# Patient Record
Sex: Male | Born: 1960 | Race: White | Hispanic: No | Marital: Single | Smoking: Never smoker
Health system: Southern US, Community
[De-identification: ages and names within clinical notes are randomized; demographics above are authoritative.]

## PROBLEM LIST (undated history)

## (undated) DIAGNOSIS — T7840XA Allergy, unspecified, initial encounter: Secondary | ICD-10-CM

## (undated) DIAGNOSIS — F1910 Other psychoactive substance abuse, uncomplicated: Secondary | ICD-10-CM

## (undated) DIAGNOSIS — E079 Disorder of thyroid, unspecified: Secondary | ICD-10-CM

## (undated) DIAGNOSIS — I10 Essential (primary) hypertension: Secondary | ICD-10-CM

## (undated) HISTORY — PX: FRACTURE SURGERY: SHX138

## (undated) HISTORY — DX: Other psychoactive substance abuse, uncomplicated: F19.10

## (undated) HISTORY — DX: Essential (primary) hypertension: I10

## (undated) HISTORY — DX: Disorder of thyroid, unspecified: E07.9

## (undated) HISTORY — PX: WISDOM TOOTH EXTRACTION: SHX21

## (undated) HISTORY — DX: Allergy, unspecified, initial encounter: T78.40XA

---

## 2000-06-07 ENCOUNTER — Emergency Department (HOSPITAL_COMMUNITY): Admission: EM | Admit: 2000-06-07 | Discharge: 2000-06-07 | Payer: Self-pay | Admitting: Emergency Medicine

## 2002-10-08 ENCOUNTER — Emergency Department (HOSPITAL_COMMUNITY): Admission: EM | Admit: 2002-10-08 | Discharge: 2002-10-08 | Payer: Self-pay | Admitting: Emergency Medicine

## 2004-07-05 ENCOUNTER — Emergency Department (HOSPITAL_COMMUNITY): Admission: EM | Admit: 2004-07-05 | Discharge: 2004-07-05 | Payer: Self-pay | Admitting: Emergency Medicine

## 2004-07-14 ENCOUNTER — Encounter: Admission: RE | Admit: 2004-07-14 | Discharge: 2004-07-14 | Payer: Self-pay | Admitting: Internal Medicine

## 2004-07-26 ENCOUNTER — Ambulatory Visit (HOSPITAL_COMMUNITY): Admission: RE | Admit: 2004-07-26 | Discharge: 2004-07-26 | Payer: Self-pay | Admitting: Internal Medicine

## 2004-07-26 ENCOUNTER — Encounter (INDEPENDENT_AMBULATORY_CARE_PROVIDER_SITE_OTHER): Payer: Self-pay | Admitting: Specialist

## 2004-08-15 ENCOUNTER — Ambulatory Visit: Payer: Self-pay | Admitting: Endocrinology

## 2004-09-14 ENCOUNTER — Ambulatory Visit: Payer: Self-pay | Admitting: Internal Medicine

## 2004-10-09 HISTORY — PX: THYROID SURGERY: SHX805

## 2004-10-17 ENCOUNTER — Ambulatory Visit: Payer: Self-pay | Admitting: Internal Medicine

## 2005-02-03 ENCOUNTER — Encounter (INDEPENDENT_AMBULATORY_CARE_PROVIDER_SITE_OTHER): Payer: Self-pay | Admitting: Specialist

## 2005-02-03 ENCOUNTER — Observation Stay (HOSPITAL_COMMUNITY): Admission: RE | Admit: 2005-02-03 | Discharge: 2005-02-04 | Payer: Self-pay | Admitting: General Surgery

## 2005-05-11 ENCOUNTER — Ambulatory Visit: Payer: Self-pay | Admitting: Internal Medicine

## 2005-07-06 IMAGING — US US BIOPSY
1 series · 6 of 6 positions shown · non-contrast
Comparison: none

CLINICAL DATA: thyroid nodule; right thyroid lesion
 ULTRASOUND-GUIDED THYROID BIOPSY

[Series 1: unknown · 0.07mm/px · 6 of 6 slices shown]
[im 1/6]
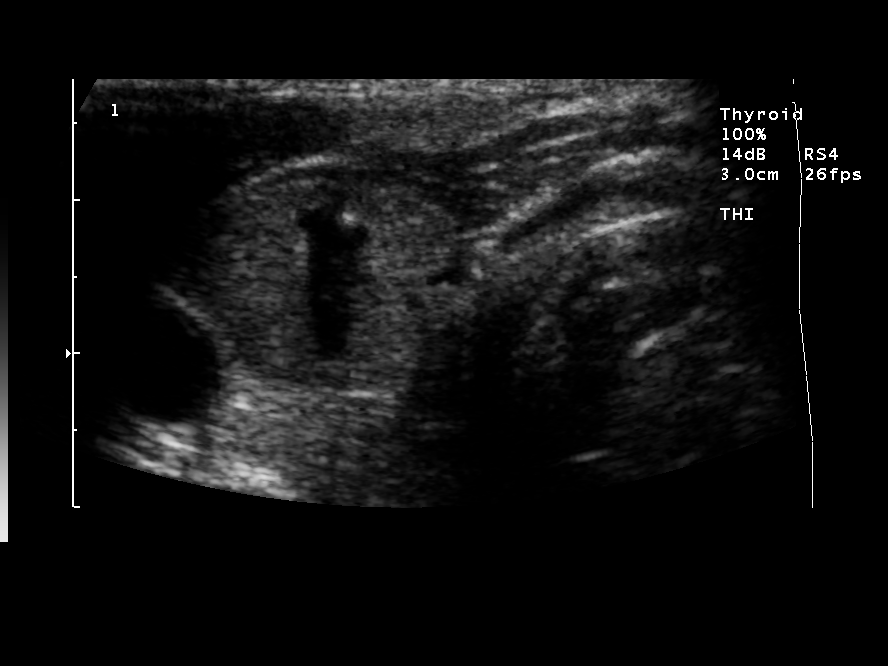
[im 2/6]
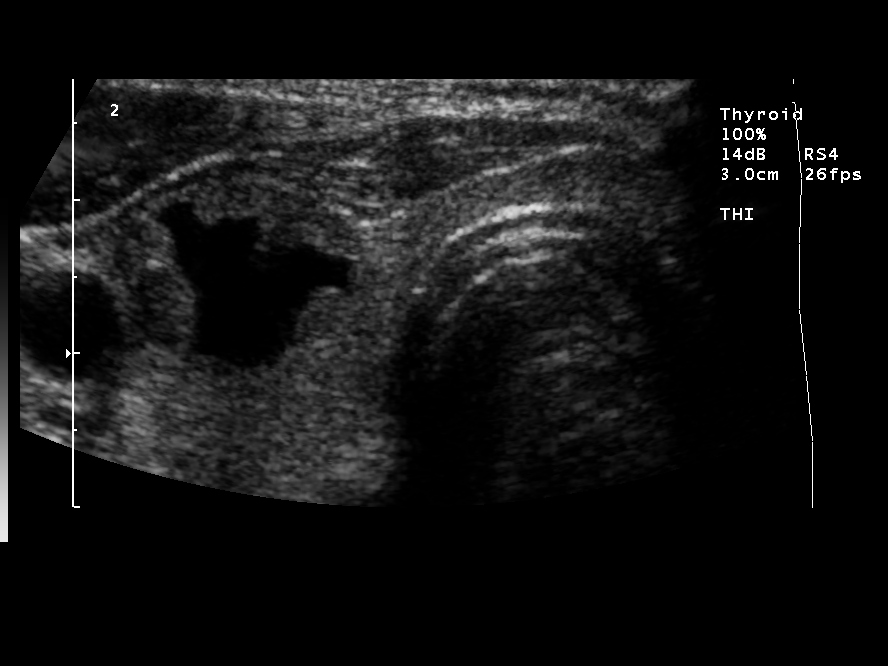
[im 3/6]
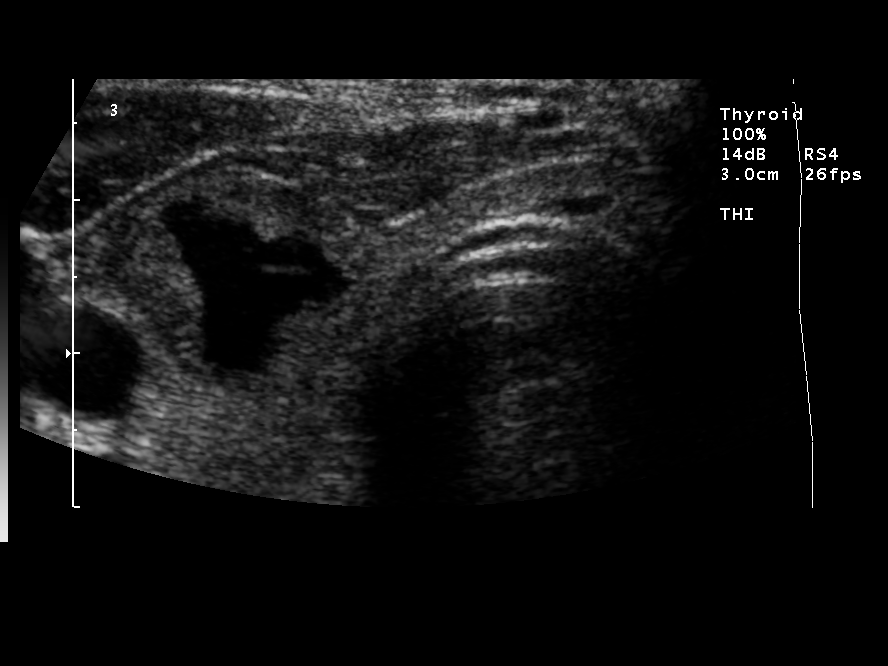
[im 4/6]
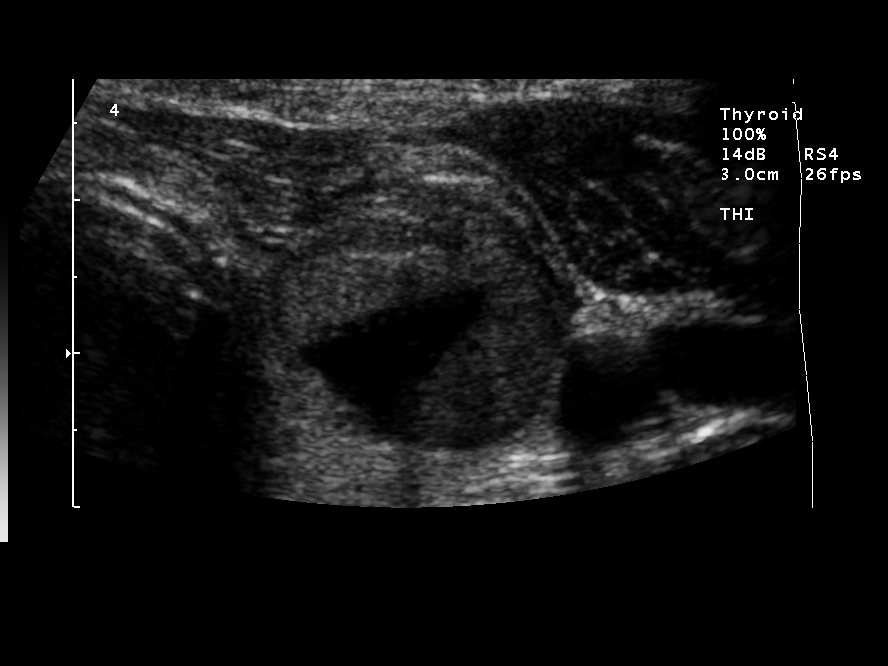
[im 5/6]
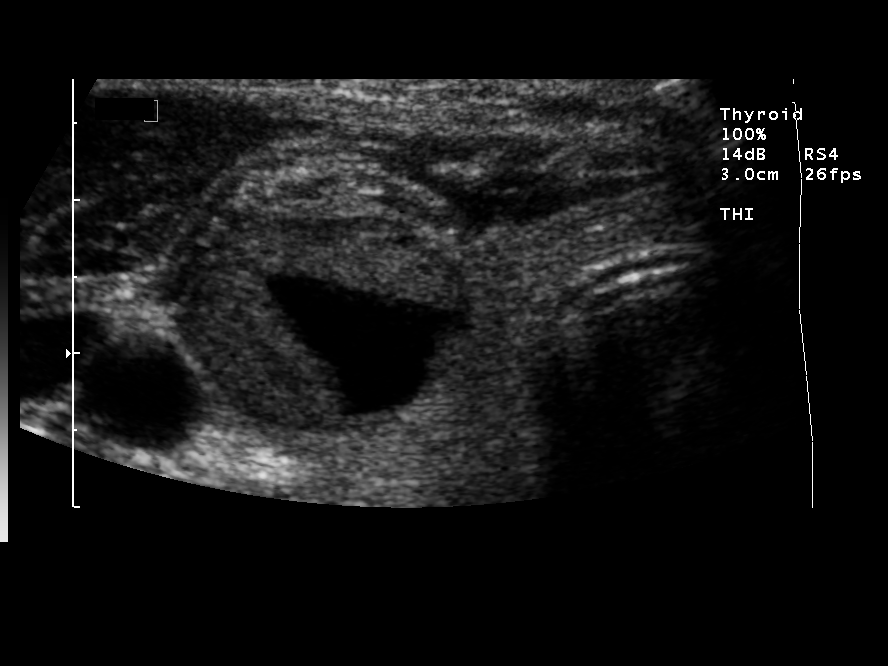
[im 6/6]
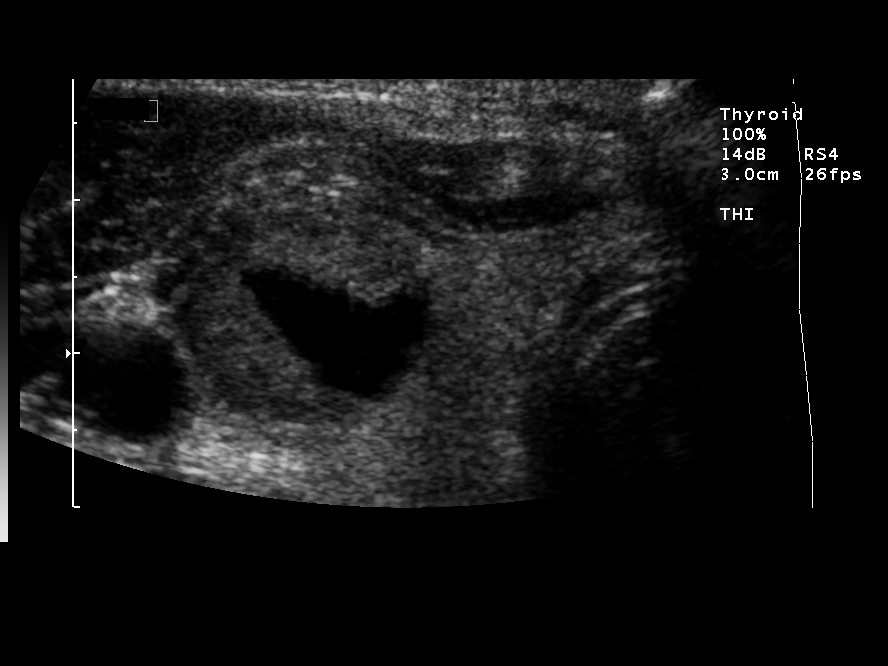

[6 of 6 positions shown; findings below may reference images not displayed]

FINDINGS: An ultrasound-guided thyroid biopsy was thoroughly discussed with the patient and questions were answered.  Risks and benefit of the procedure were also delineated.  Risks specifically discussed included bleeding, bruising, infection and risk of injury to adjacent blood vessels and nerves.  The patient understands and wishes to proceed.  Verbal and written consent was obtained.  
 After the patient was prepped and draped in normal sterile fashion 1% lidocaine was used for local anesthesia using direct ultrasound guidance.  Four passes were made using 25 gauge hypodermic needle into the nodule within the right thyroid.  The specimens were given to cytology for further analysis.  The patient tolerated the procedure well and there were no immediate complications.  No hematoma was identified post-procedure.  
 IMPRESSION
 Successful ultrasound guided FNA of right lobe of the thyroid.  M.D. present during the procedure was Vasyl Gardener.

## 2007-02-28 ENCOUNTER — Ambulatory Visit: Payer: Self-pay | Admitting: Internal Medicine

## 2007-03-19 ENCOUNTER — Telehealth (INDEPENDENT_AMBULATORY_CARE_PROVIDER_SITE_OTHER): Payer: Self-pay | Admitting: *Deleted

## 2007-06-03 ENCOUNTER — Ambulatory Visit: Payer: Self-pay | Admitting: Internal Medicine

## 2007-06-03 ENCOUNTER — Ambulatory Visit: Payer: Self-pay | Admitting: *Deleted

## 2007-06-03 LAB — CONVERTED CEMR LAB
ALT: 17 units/L (ref 0–53)
AST: 17 units/L (ref 0–37)
Albumin: 4.5 g/dL (ref 3.5–5.2)
Alkaline Phosphatase: 80 units/L (ref 39–117)
BUN: 15 mg/dL (ref 6–23)
CO2: 24 meq/L (ref 19–32)
Calcium: 9.6 mg/dL (ref 8.4–10.5)
Chloride: 104 meq/L (ref 96–112)
Cholesterol: 223 mg/dL — ABNORMAL HIGH (ref 0–200)
Creatinine, Ser: 0.97 mg/dL (ref 0.40–1.50)
Glucose, Bld: 63 mg/dL — ABNORMAL LOW (ref 70–99)
HDL: 42 mg/dL (ref >39)
LDL Cholesterol: 165 mg/dL — ABNORMAL HIGH (ref 0–99)
Potassium: 3.9 meq/L (ref 3.5–5.3)
Sodium: 142 meq/L (ref 135–145)
TSH: 12.591 microintl units/mL — ABNORMAL HIGH (ref 0.350–5.50)
Total Bilirubin: 0.4 mg/dL (ref 0.3–1.2)
Total CHOL/HDL Ratio: 5.3
Total Protein: 7.4 g/dL (ref 6.0–8.3)
Triglycerides: 79 mg/dL (ref <150)
VLDL: 16 mg/dL (ref 0–40)

## 2007-06-12 ENCOUNTER — Ambulatory Visit: Payer: Self-pay | Admitting: Internal Medicine

## 2007-08-15 ENCOUNTER — Ambulatory Visit: Payer: Self-pay | Admitting: Internal Medicine

## 2007-08-15 LAB — CONVERTED CEMR LAB: TSH: 4.7 microintl units/mL (ref 0.350–5.50)

## 2008-03-26 ENCOUNTER — Ambulatory Visit: Payer: Self-pay | Admitting: Internal Medicine

## 2008-04-28 ENCOUNTER — Ambulatory Visit: Payer: Self-pay | Admitting: Internal Medicine

## 2008-08-05 ENCOUNTER — Ambulatory Visit: Payer: Self-pay | Admitting: Internal Medicine

## 2008-08-05 LAB — CONVERTED CEMR LAB: TSH: 4.476 microintl units/mL (ref 0.350–4.50)

## 2008-11-17 ENCOUNTER — Ambulatory Visit: Payer: Self-pay | Admitting: Internal Medicine

## 2008-11-17 LAB — CONVERTED CEMR LAB
BUN: 17 mg/dL (ref 6–23)
CO2: 25 meq/L (ref 19–32)
Calcium: 9.5 mg/dL (ref 8.4–10.5)
Chloride: 99 meq/L (ref 96–112)
Cholesterol: 272 mg/dL — ABNORMAL HIGH (ref 0–200)
Creatinine, Ser: 1.14 mg/dL (ref 0.40–1.50)
Glucose, Bld: 93 mg/dL (ref 70–99)
HDL: 49 mg/dL (ref >39)
LDL Cholesterol: 199 mg/dL — ABNORMAL HIGH (ref 0–99)
Potassium: 3.9 meq/L (ref 3.5–5.3)
Sodium: 138 meq/L (ref 135–145)
TSH: 3.617 microintl units/mL (ref 0.350–4.50)
Total CHOL/HDL Ratio: 5.6
Triglycerides: 120 mg/dL (ref <150)
VLDL: 24 mg/dL (ref 0–40)

## 2009-01-14 ENCOUNTER — Ambulatory Visit: Payer: Self-pay | Admitting: Internal Medicine

## 2009-06-07 ENCOUNTER — Emergency Department (HOSPITAL_COMMUNITY): Admission: EM | Admit: 2009-06-07 | Discharge: 2009-06-07 | Payer: Self-pay | Admitting: Family Medicine

## 2009-12-06 ENCOUNTER — Ambulatory Visit: Payer: Self-pay | Admitting: Internal Medicine

## 2009-12-06 LAB — CONVERTED CEMR LAB
Cholesterol: 236 mg/dL — ABNORMAL HIGH (ref 0–200)
HDL: 46 mg/dL (ref >39)
LDL Cholesterol: 172 mg/dL — ABNORMAL HIGH (ref 0–99)
Total CHOL/HDL Ratio: 5.1
Triglycerides: 92 mg/dL (ref <150)
VLDL: 18 mg/dL (ref 0–40)

## 2010-01-19 ENCOUNTER — Ambulatory Visit: Payer: Self-pay | Admitting: Internal Medicine

## 2010-02-23 ENCOUNTER — Ambulatory Visit: Payer: Self-pay | Admitting: Internal Medicine

## 2010-02-23 LAB — CONVERTED CEMR LAB
ALT: 29 units/L (ref 0–53)
AST: 22 units/L (ref 0–37)
Albumin: 4.5 g/dL (ref 3.5–5.2)
Alkaline Phosphatase: 85 units/L (ref 39–117)
BUN: 21 mg/dL (ref 6–23)
CO2: 22 meq/L (ref 19–32)
Calcium: 9.6 mg/dL (ref 8.4–10.5)
Chloride: 106 meq/L (ref 96–112)
Cholesterol: 131 mg/dL (ref 0–200)
Creatinine, Ser: 1.45 mg/dL (ref 0.40–1.50)
Glucose, Bld: 95 mg/dL (ref 70–99)
HDL: 45 mg/dL (ref >39)
LDL Cholesterol: 64 mg/dL (ref 0–99)
Potassium: 3.8 meq/L (ref 3.5–5.3)
Sodium: 140 meq/L (ref 135–145)
TSH: 6.46 microintl units/mL — ABNORMAL HIGH (ref 0.350–4.500)
Total Bilirubin: 0.3 mg/dL (ref 0.3–1.2)
Total CHOL/HDL Ratio: 2.9
Total Protein: 7.2 g/dL (ref 6.0–8.3)
Triglycerides: 109 mg/dL (ref <150)
VLDL: 22 mg/dL (ref 0–40)

## 2010-05-20 ENCOUNTER — Ambulatory Visit: Payer: Self-pay | Admitting: Internal Medicine

## 2010-05-20 LAB — CONVERTED CEMR LAB
ALT: 41 units/L (ref 0–53)
AST: 27 units/L (ref 0–37)
Albumin: 4.4 g/dL (ref 3.5–5.2)
Alkaline Phosphatase: 85 units/L (ref 39–117)
BUN: 28 mg/dL — ABNORMAL HIGH (ref 6–23)
CO2: 26 meq/L (ref 19–32)
Calcium: 9.7 mg/dL (ref 8.4–10.5)
Chloride: 106 meq/L (ref 96–112)
Cholesterol: 148 mg/dL (ref 0–200)
Creatinine, Ser: 1.48 mg/dL (ref 0.40–1.50)
Direct LDL: 84 mg/dL
Free T4: 1.33 ng/dL (ref 0.80–1.80)
Glucose, Bld: 96 mg/dL (ref 70–99)
HDL: 51 mg/dL (ref >39)
LDL Cholesterol: 84 mg/dL (ref 0–99)
Potassium: 4.1 meq/L (ref 3.5–5.3)
Sodium: 141 meq/L (ref 135–145)
TSH: 7.366 microintl units/mL — ABNORMAL HIGH (ref 0.350–4.500)
Total Bilirubin: 0.4 mg/dL (ref 0.3–1.2)
Total CHOL/HDL Ratio: 2.9
Total Protein: 7.1 g/dL (ref 6.0–8.3)
Triglycerides: 63 mg/dL (ref <150)
VLDL: 13 mg/dL (ref 0–40)

## 2010-08-18 ENCOUNTER — Encounter (INDEPENDENT_AMBULATORY_CARE_PROVIDER_SITE_OTHER): Payer: Self-pay | Admitting: Internal Medicine

## 2010-08-18 LAB — CONVERTED CEMR LAB
Free T4: 1.37 ng/dL (ref 0.80–1.80)
TSH: 3.924 microintl units/mL (ref 0.350–4.500)

## 2011-02-21 NOTE — Assessment & Plan Note (Signed)
[facility]                        [place] OFFICE NOTE   James, [name]                       MRN:          [id number]  DATE:02/28/2007                            DOB:          22-Jan-1961    James [name] was seen for medication refill on Feb 28, 2007.   Doctor has been off his blood pressure medicine for at least 6 months.  He has not been monitoring his blood pressure, but found this to be  175/113 this morning at a grocery store.  He has restricted his caffeine  for the last 30 days.   He had not filled the medicines, as he has returned to school to work on  a sports/exercise.  The lack of a job and  the associated financial  stresses led to his stopping meds.   He has had intermittent left axillary chest pain, which is sharp.  There  is no specific trigger.  It is not worse with exercise.  He is able to  run on a treadmill or outside, 6.1 miles per hour for 35 minutes with no  cardiopulmonary symptoms.   He denies dysphagia, melena, or rectal bleeding.   He has had no neurologic signs or symptoms.   PAST MEDICAL HISTORY:  Does reveal thyroid lobectomy 2006 for a nodule.  The biopsy of the thyroid had revealed mixed findings.  He has also  had a traumatic thumb injury and oral surgery.  He is a recovering  alcoholic.   Mother had brain cancer and schizophrenia.  Grandmother had  hypertension.  Grandfather had a heart attack.   He does not smoke or drink.   He has not had any chest pain for at least 2 weeks.   At this time, blood pressure is 178/94.  This would be associated with  11 to 12 fold increase in risk for heart attack or stroke.  He has no icterus or jaundice.  There is arterial narrowing, but no palpable edema.  Splitting of the 1st heart sound with an increased 2nd heart sound is  present.  There is no carotid bruit , perirenal bruits or aortic aneurysm.All  pulses are intact ; no edema is noted.There is no  lymphadenopathy or  organomegaly.   His EKG shows diffuse nonspecific ST-T wave changes.  There may be  slight decrease in T voltage in the limb leads, and possibly slight  improvement in V1 and V2;these changes are subtle. LVH by voltage is  documented.   He will be prescribed 90 days of medication through the [facility]  Program;each RX would cost 10 dollars.(# 90 hydrochlorothiazide 25 mg  1/2 daily,# 90 benazepril 40 mg 1/2 daily, and # 180 metoprolol 100 mg  1/2 daily).  This would place the cost for all medication in the range  of  $4/ month.   I will ask him to monitor his stools; prescription was provided for  generic ranitidine to take every 12 hours if he is having the atypical  painsymptoms. If this persists or progresses further evaluation was  recommended.   He will be asked to  monitor the blood pressurewith goal of less than  130/85. I mentioned that he may be acandidate for enrollment @  [facility] , clinic for underinsured/uninsured if seeing me were a  financial burden.As I was leaving the exam room ,he requested a Rx for  Viagra (cost= >$6/pill). I provided the Rx & 12 50 mg samples.   If the blood pressure is not at goal, he will be asked to return for  medication adjustment and lab studies.     Titus Dubin. Alwyn Ren, MD,FACP,FCCP  Electronically Signed    [facility]/MedQ  DD: 02/28/2007  DT: 02/28/2007  Job #: [id number]

## 2011-02-24 NOTE — Op Note (Signed)
[name], [name]                ACCOUNT NO.:  [id number]   MEDICAL RECORD NO.:  [id number]          PATIENT TYPE:  OBV   LOCATION:  0447                         FACILITY:  [facility]   PHYSICIAN:  Anselm Pancoast. Weatherly, M.D.DATE OF BIRTH:  Apr 06, 1961   DATE OF PROCEDURE:  02/03/2005  DATE OF DISCHARGE:                                 OPERATIVE REPORT   PREOPERATIVE DIAGNOSIS:  Right thyroid nodule.   POSTOPERATIVE DIAGNOSIS:  Right thyroid nodule.  Await final pathology.  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   OPERATION/PROCEDURE:  Right thyroid lobectomy, isthmusectomy.   SURGEON:  Anselm Pancoast. Zachery Dakins, M.D.   ASSISTANT:  Velora Heckler, M.D.   ANESTHESIA:  General.   HISTORY:  James [name] is a 50 year old male who was referred to [place] by Dr.  Marga Melnick and Dr. Everardo All after a nodule was picked up on physical  examination by Dr. Alwyn Ren of this 49 year old male who is treated for  hypertension.  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  I  saw the patient and on examination, a mass was not easily felt.  It  certainly could not cause any type of pressure symptoms of swallowing or  noticeable and the patient is a recovering substance abuser and also is on  hypertensive medication.  He was recommended that a right thyroid lobectomy  be performed and the patient's in agreement.  The patient looks younger than  his stated age and on examination I do not appreciate any cervical  lymphadenopathy.   DESCRIPTION OF PROCEDURE:  The patient was given 1 g of Kefzol, positioned  on the OR table.  The incision, right side, had been marked and the patient  identified, and then induction of general anesthesia endotracheal tube.  We  placed a roll up under the scapula area and then hyperextended  or extended  his neck so that the thyroid cartilage is palpable and exposing the incision  area.   Next, the neck was prepped with Betadine surgical solution and draped in a  sterile manner.  I marked the incision area about two fingerbreadths above  the xiphoid and tried to make the incisions symmetrical, extended just a  little bit more to the right and then dissected down, identifying the  platysma.  These were elevated.  There was a midline superficial vein that  was divided and ligated with 4-0 Vicryl.  A Mayhorn retractor was placed and  then midline divided between the strap muscles and both of these were  elevated.  The right lobe was reasonably small.  We could feel the nodule.  There was some irritation around it, most likely from the biopsy and kind of  finger dissected and worked it in this plane, elevating this underlying  strap muscles off the mass.  Then the middle vein was identified.  It was  small and it was tied with  4-0 Vicryl distally and clipped medially and  divided.  Then we elected to kind of work on the superior pole first so that  the most distal portion of superior pole and its vessels were encompassed  and then tied with a 2-0 silk.  A clip was placed proximally to the tie and  a tie on the gland itself and then divided.  This gave [place] a little freedom  and kind of rotating the gland medially.  The inferior aspect of the gland  was carefully divided, clipping on the stay side, cauterization on the go  side.  The little branches of the inferior thyroid artery as they entered  into the gland itself and not dissected laterally, trying to identify the  inferior recurrent laryngeal nerve in this location.  Then with being able  to rotate the gland medially but this time the nodule was within the body  and gland.  We were well free of that. We then were able to identify the  recurrent laryngeal nerve on the right and then with this under direct  vision, divided the  little attachments of the thyroid from the trachea and  larynx, watching where the nerve goes into the lateral __________ area and  then freeing up the more central attachment of the gland.  There was a  little isthmus in the superior lobe that was divided but it was minimal and  then the thyroid itself was unclamped with a hemostat to the left of  midline.  This was sutured with a 4-0 Vicryl and the specimen sent for  frozen exam.   The nodule appeared to be within the gland, not exposed.  When Dr. 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  There was nothing  palpable visually or ultrasound on the left lobe and we elected not to do a  left lobectomy.  In the patient's incision, we placed a little piece of  Surgicel right over where the nerve and there was good hemostasis and the  strap muscles were approximated with a 4-0 Vicryl and platysma approximated  with 4-0 Vicryl interrupted sutures, 4-0 Monocryl subcuticular, and Benzoin  and Steri-Strips on the skin.  The patient tolerated the procedure nicely  and was extubated and sent to the recovery room in stable postoperative  condition.  He should be ready for discharge in the a.m. and we will wait  for the final pathology report.      [facility]/MEDQ  D:  02/03/2005  T:  02/03/2005  Job:  [id number]   cc:   Gregary Signs A. Everardo All, M.D. [facility]   Titus Dubin. Alwyn Ren, M.D. [facility]

## 2011-06-08 ENCOUNTER — Emergency Department (HOSPITAL_COMMUNITY)
Admission: EM | Admit: 2011-06-08 | Discharge: 2011-06-08 | Discharge status: Against Medical Advice | Payer: Self-pay | Attending: Emergency Medicine | Admitting: Emergency Medicine

## 2012-11-22 ENCOUNTER — Ambulatory Visit: Payer: Self-pay | Admitting: Family Medicine

## 2012-11-23 ENCOUNTER — Other Ambulatory Visit: Payer: Self-pay | Admitting: Family Medicine

## 2012-11-29 ENCOUNTER — Ambulatory Visit: Payer: Self-pay | Admitting: Family Medicine

## 2012-12-23 ENCOUNTER — Ambulatory Visit (INDEPENDENT_AMBULATORY_CARE_PROVIDER_SITE_OTHER): Payer: BC Managed Care – PPO | Admitting: Family Medicine

## 2012-12-23 VITALS — BP 135/82 | HR 59 | Temp 98.2°F | Resp 17 | Ht 64.0 in | Wt 145.0 lb

## 2012-12-23 DIAGNOSIS — E039 Hypothyroidism, unspecified: Secondary | ICD-10-CM | POA: Insufficient documentation

## 2012-12-23 DIAGNOSIS — E785 Hyperlipidemia, unspecified: Secondary | ICD-10-CM | POA: Insufficient documentation

## 2012-12-23 DIAGNOSIS — Z79899 Other long term (current) drug therapy: Secondary | ICD-10-CM

## 2012-12-23 DIAGNOSIS — I10 Essential (primary) hypertension: Secondary | ICD-10-CM | POA: Insufficient documentation

## 2012-12-23 LAB — COMPREHENSIVE METABOLIC PANEL
ALT: 17 U/L (ref 0–53)
AST: 18 U/L (ref 0–37)
Albumin: 5 g/dL (ref 3.5–5.2)
Alkaline Phosphatase: 70 U/L (ref 39–117)
BUN: 15 mg/dL (ref 6–23)
CO2: 29 mEq/L (ref 19–32)
Calcium: 9.9 mg/dL (ref 8.4–10.5)
Chloride: 100 mEq/L (ref 96–112)
Creat: 0.94 mg/dL (ref 0.50–1.35)
Glucose, Bld: 86 mg/dL (ref 70–99)
Potassium: 4.2 mEq/L (ref 3.5–5.3)
Sodium: 139 mEq/L (ref 135–145)
Total Bilirubin: 0.6 mg/dL (ref 0.3–1.2)
Total Protein: 7.4 g/dL (ref 6.0–8.3)

## 2012-12-23 LAB — TSH: TSH: 0.976 u[IU]/mL (ref 0.350–4.500)

## 2012-12-23 MED ORDER — SIMVASTATIN 20 MG PO TABS: 20.0000 mg | ORAL_TABLET | Freq: Every evening | ORAL | Status: DC

## 2012-12-23 MED ORDER — DILTIAZEM HCL ER BEADS 240 MG PO CP24: 240.0000 mg | ORAL_CAPSULE | Freq: Every day | ORAL | Status: DC

## 2012-12-23 MED ORDER — LISINOPRIL 20 MG PO TABS: 20.0000 mg | ORAL_TABLET | Freq: Every day | ORAL | Status: DC

## 2012-12-23 MED ORDER — LEVOTHYROXINE SODIUM 88 MCG PO TABS: 88.0000 ug | ORAL_TABLET | Freq: Every day | ORAL | Status: DC

## 2012-12-23 MED ORDER — CETIRIZINE HCL 10 MG PO TABS: 10.0000 mg | ORAL_TABLET | Freq: Every day | ORAL | Status: DC

## 2012-12-23 NOTE — Patient Instructions (Addendum)
Schedule an appointment with me next door at our continuity clinic for your complete physical.

## 2012-12-23 NOTE — Progress Notes (Addendum)
Subjective:    Patient ID: James [name], male    DOB: 1961/04/26, 52 y.o.   MRN: [id number]  HPI  James [name] is a prior patient of mine from [facility].  He has not seen a physician since I last saw about a year ago and he is here for refills.  He finally got ObamaCare! and so is finally able to establish with a PCP - he made an appt with Dr. Iline Oven at [facility] but couldn't get in for sev mos so is here today and would like to just establish care here if he could.   He has not ran out of any of his medications but will run out this wk.  He is not fasting today but last ate about 8 hrs ago - a biscuit  Prev saw allergist [facility] - allergic to grass, dust.  No h/o asthma or tobacco use.  Past Medical History  Diagnosis Date  . Allergy   . Substance abuse   . Thyroid disease   . Hypertension    No current outpatient prescriptions on file prior to visit.   No current facility-administered medications on file prior to visit.   No Known Allergies  Past Surgical History  Procedure Laterality Date  . Fracture surgery    . Wisdom tooth extraction      Review of Systems  Constitutional: Negative for fever and chills.  HENT: Positive for congestion and rhinorrhea.   Eyes: Positive for itching. Negative for visual disturbance.  Respiratory: Negative for shortness of breath.   Cardiovascular: Negative for chest pain and leg swelling.  Neurological: Negative for dizziness, syncope, facial asymmetry, weakness, light-headedness and headaches.      BP 135/82  Pulse 59  Temp(Src) 98.2 F (36.8 C) (Oral)  Resp 17  Ht 5\' 4"  (1.626 m)  Wt 145 lb (65.772 kg)  BMI 24.88 kg/m2  SpO2 97% Objective:   Physical Exam  Constitutional: He is oriented to person, place, and time. He appears well-developed and well-nourished. No distress.  HENT:  Head: Normocephalic and atraumatic.  Right Ear: Tympanic membrane, external ear and ear canal normal.   Left Ear: Tympanic membrane, external ear and ear canal normal.  Nose: Mucosal edema and rhinorrhea present.  Mouth/Throat: Uvula is midline, oropharynx is clear and moist and mucous membranes are normal.  Eyes: Conjunctivae are normal. Pupils are equal, round, and reactive to light. No scleral icterus.  Neck: Normal range of motion. Neck supple. No thyromegaly present.  Cardiovascular: Normal rate, regular rhythm, normal heart sounds and intact distal pulses.   Pulmonary/Chest: Effort normal and breath sounds normal. No respiratory distress.  Musculoskeletal: He exhibits no edema.  Lymphadenopathy:    He has no cervical adenopathy.  Neurological: He is alert and oriented to person, place, and time.  Skin: Skin is warm and dry. He is not diaphoretic.  Psychiatric: He has a normal mood and affect. His behavior is normal.     Received and reviewed records from [facility] and will be scanned in.  He was a pt there since 2008 Assessment & Plan:  Hypothyroid - Plan: levothyroxine (SYNTHROID, LEVOTHROID) 88 MCG tablet, TSH - dose stable for years, TSH was 2.9 on 04/22/12.  Essential hypertension, benign - Plan: diltiazem (TIAZAC) 240 MG 24 hr capsule, lisinopril (PRINIVIL,ZESTRIL) 20 MG tablet - pt was on hctz prev but he developed a lot of sun sensitivity w/ that  Other and unspecified hyperlipidemia - Plan: simvastatin (ZOCOR)  20 MG tablet - lipids off meds in 2008 showed LDL of 199. Last labs on 04/22/12 show a LDL of 83, trig 80, hdl 46, and chol 145 on simvastatin 20mg  - may be able to wean off statin as goal LDL is <130. Discuss at f/u.  Encounter for long-term (current) use of other medications - Plan: Comprehensive metabolic panel  Allergic rhinitis - Plan: cetirizine (ZYRTEC) 10 MG tablet   Meds ordered this encounter  Medications  .       .       .       .       . diltiazem (TIAZAC) 240 MG 24 hr capsule    Sig: Take 1 capsule (240 mg total) by mouth daily.    Dispense:  30 capsule     Refill:  5  . levothyroxine (SYNTHROID, LEVOTHROID) 88 MCG tablet    Sig: Take 1 tablet (88 mcg total) by mouth daily.    Dispense:  30 tablet    Refill:  5  . lisinopril (PRINIVIL,ZESTRIL) 20 MG tablet    Sig: Take 1 tablet (20 mg total) by mouth daily.    Dispense:  30 tablet    Refill:  5  . simvastatin (ZOCOR) 20 MG tablet    Sig: Take 1 tablet (20 mg total) by mouth every evening.    Dispense:  30 tablet    Refill:  5  . cetirizine (ZYRTEC) 10 MG tablet    Sig: Take 1 tablet (10 mg total) by mouth daily.    Dispense:  30 tablet    Refill:  11

## 2012-12-25 ENCOUNTER — Encounter: Payer: Self-pay | Admitting: Family Medicine

## 2013-01-04 ENCOUNTER — Encounter: Payer: Self-pay | Admitting: Family Medicine

## 2013-01-20 ENCOUNTER — Ambulatory Visit: Payer: Self-pay | Admitting: Family Medicine

## 2013-02-04 ENCOUNTER — Ambulatory Visit: Payer: BC Managed Care – PPO

## 2013-02-04 ENCOUNTER — Encounter: Payer: Self-pay | Admitting: Family Medicine

## 2013-02-04 ENCOUNTER — Ambulatory Visit (INDEPENDENT_AMBULATORY_CARE_PROVIDER_SITE_OTHER): Payer: BC Managed Care – PPO | Admitting: Family Medicine

## 2013-02-04 VITALS — BP 114/70 | HR 60 | Temp 98.6°F | Resp 16 | Ht 64.5 in | Wt 143.8 lb

## 2013-02-04 DIAGNOSIS — M25561 Pain in right knee: Secondary | ICD-10-CM

## 2013-02-04 DIAGNOSIS — M25569 Pain in unspecified knee: Secondary | ICD-10-CM

## 2013-02-04 MED ORDER — DICLOFENAC SODIUM 75 MG PO TBEC: 75.0000 mg | DELAYED_RELEASE_TABLET | Freq: Two times a day (BID) | ORAL | Status: DC

## 2013-02-04 NOTE — Patient Instructions (Addendum)
Avoid running or excessive use of right knee for the next 10-14 days.  Take diclofenac one twice daily at breakfast and supper for pain and inflammation (do not take ibuprofen or Aleve while you are on this)  If not doing much better in the next 10-14 days we will make a referral to a specialist.

## 2013-02-04 NOTE — Progress Notes (Signed)
Subjective: For the past several weeks the patient has been having some pain in his right knee. About 2 weeks. If, but he thinks his knee was hurting him before that. He is a runner. The last few days he stopped running because been hurting so much. He says it hurts him when he walks. He works a temporary job currently, does a little of everything he says. He talked to the sports medicine students at the [employer]. They advised him to see a physician. He says is been limping on it some.   Objective: Normal gait today. Knee is minimally tender in the patella and proximal fibular areas. Joint seems to be intact with no laxity of ligaments. No effusion.  Assessment: Right knee pain  Plan: X-ray right knee [facility] reading (PRIMARY) by  Dr. Alwyn Ren Normal knee  Will treat symptomatically and refer if sx persist.

## 2013-02-12 ENCOUNTER — Telehealth: Payer: Self-pay

## 2013-02-12 NOTE — Telephone Encounter (Signed)
PATIENT SAID HE WILL COME BACK IN ON 02/13/2013 WHEN DR HOPPER IS WORKING; SOMETIME AROUND 12....Marland KitchenMarland Kitchen

## 2013-02-12 NOTE — Telephone Encounter (Signed)
PATIENT WALKED IN TO OFFICE TO LEAVE A MESSAGE. NEEDS A NOTE TO GO BACK TO WORK. SAYS HE IS IN A GRAY AREA BECAUSE HE WAS WRITTEN OUT FOR 10-14 DAYS AND HIS EMPLOYERS WANTS AN EXACT DATE. PT WANTS TO GO BACK TO WORK THIS WEEKEND.  SAYS HE DOES NOT HAVE A WORKING NUMBER. SAYS HE WILL JUST COME BACK IN TO THE OFFICE TO SEE WHAT CAN BE DONE.

## 2013-02-13 ENCOUNTER — Telehealth: Payer: Self-pay | Admitting: Radiology

## 2013-02-13 NOTE — Telephone Encounter (Signed)
Patient provided note he may work without restrictions at this point. Per his request.

## 2013-03-14 ENCOUNTER — Encounter: Payer: Self-pay | Admitting: Family Medicine

## 2013-03-14 ENCOUNTER — Ambulatory Visit (INDEPENDENT_AMBULATORY_CARE_PROVIDER_SITE_OTHER): Payer: BC Managed Care – PPO | Admitting: Family Medicine

## 2013-03-14 VITALS — BP 146/86 | HR 58 | Temp 97.9°F | Resp 16 | Ht 65.0 in | Wt 150.0 lb

## 2013-03-14 DIAGNOSIS — Z0000 Encounter for general adult medical examination without abnormal findings: Secondary | ICD-10-CM

## 2013-03-14 DIAGNOSIS — Z23 Encounter for immunization: Secondary | ICD-10-CM

## 2013-03-14 LAB — CBC WITH DIFFERENTIAL/PLATELET
Basophils Absolute: 0 10*3/uL (ref 0.0–0.1)
Basophils Relative: 1 % (ref 0–1)
Eosinophils Absolute: 0.2 10*3/uL (ref 0.0–0.7)
Eosinophils Relative: 4 % (ref 0–5)
HCT: 40.6 % (ref 39.0–52.0)
Hemoglobin: 13.8 g/dL (ref 13.0–17.0)
Lymphocytes Relative: 25 % (ref 12–46)
Lymphs Abs: 1.4 10*3/uL (ref 0.7–4.0)
MCH: 29.2 pg (ref 26.0–34.0)
MCHC: 34 g/dL (ref 30.0–36.0)
MCV: 85.8 fL (ref 78.0–100.0)
Monocytes Absolute: 0.5 10*3/uL (ref 0.1–1.0)
Monocytes Relative: 9 % (ref 3–12)
Neutro Abs: 3.5 10*3/uL (ref 1.7–7.7)
Neutrophils Relative %: 61 % (ref 43–77)
Platelets: 355 10*3/uL (ref 150–400)
RBC: 4.73 MIL/uL (ref 4.22–5.81)
RDW: 14.6 % (ref 11.5–15.5)
WBC: 5.7 10*3/uL (ref 4.0–10.5)

## 2013-03-14 LAB — IFOBT (OCCULT BLOOD): IFOBT: NEGATIVE

## 2013-03-14 NOTE — Progress Notes (Signed)
Subjective:    Patient ID: James [name], male    DOB: 1960/11/28, 52 y.o.   MRN: [id number] Chief Complaint  Patient presents with  . Annual Exam   HPI  Did not take BP meds today as he was fasting since midnight last night.    Spends a lot of time in the sun and so he doesn't take zyrtec as he thinks it makes him red and sun-sensitive . . .  Does not remember last TDaP but if fine with having it repeated today. Does not remember last colonoscopy  - likely has not had one due to lack of prior health insurance - none seen in Epic, no paper chart.   Past Medical History  Diagnosis Date  . Allergy   . Substance abuse   . Thyroid disease   . Hypertension    Current Outpatient Prescriptions on File Prior to Visit  Medication Sig Dispense Refill  . diltiazem (TIAZAC) 240 MG 24 hr capsule Take 1 capsule (240 mg total) by mouth daily.  30 capsule  5  . levothyroxine (SYNTHROID, LEVOTHROID) 88 MCG tablet Take 1 tablet (88 mcg total) by mouth daily.  30 tablet  5  . lisinopril (PRINIVIL,ZESTRIL) 20 MG tablet Take 1 tablet (20 mg total) by mouth daily.  30 tablet  5  . simvastatin (ZOCOR) 20 MG tablet Take 1 tablet (20 mg total) by mouth every evening.  30 tablet  5  . diclofenac (VOLTAREN) 75 MG EC tablet Take 1 tablet (75 mg total) by mouth 2 (two) times daily.  30 tablet  1   No current facility-administered medications on file prior to visit.   No Known Allergies Past Surgical History  Procedure Laterality Date  . Fracture surgery    . Wisdom tooth extraction    . Thyroid surgery  2006   . Family History  Problem Relation Age of Onset  . Mental illness Mother   . Hypertension Father   . Hypertension Maternal Grandmother   . Hypertension Paternal Grandmother   . Heart disease Paternal Grandfather    History   Social History  . Marital Status: Single    Spouse Name: N/A    Number of Children: N/A  . Years of Education: N/A   Social History Main Topics  . Smoking  status: Never Smoker   . Smokeless tobacco: None  . Alcohol Use: No  . Drug Use: No  . Sexually Active: No   Other Topics Concern  . None   Social History Narrative  . None     Review of Systems  All other systems reviewed and are negative.      BP 146/86  Pulse 58  Temp(Src) 97.9 F (36.6 C)  Resp 16  Ht 5\' 5"  (1.651 m)  Wt 150 lb (68.04 kg)  BMI 24.96 kg/m2 Objective:   Physical Exam  Constitutional: He is oriented to person, place, and time. He appears well-developed and well-nourished. No distress.  HENT:  Head: Normocephalic and atraumatic.  Right Ear: Tympanic membrane, external ear and ear canal normal.  Left Ear: Tympanic membrane, external ear and ear canal normal.  Nose: Nose normal.  Mouth/Throat: Uvula is midline, oropharynx is clear and moist and mucous membranes are normal. No oropharyngeal exudate.  Eyes: Conjunctivae are normal. Right eye exhibits no discharge. Left eye exhibits no discharge. No scleral icterus.  Neck: Normal range of motion. Neck supple. No thyromegaly present.  Cardiovascular: Normal rate, regular rhythm, normal heart sounds and intact  distal pulses.   Pulmonary/Chest: Effort normal and breath sounds normal. No respiratory distress.  Abdominal: Soft. Bowel sounds are normal. He exhibits no distension and no mass. There is no tenderness. There is no rebound and no guarding. Hernia confirmed negative in the right inguinal area and confirmed negative in the left inguinal area.  Genitourinary: Rectum normal, prostate normal and testes normal. Rectal exam shows no fissure, no tenderness and anal tone normal. Guaiac negative stool. Prostate is not enlarged and not tender. Right testis shows no mass, no swelling and no tenderness. Right testis is descended. Left testis shows no mass, no swelling and no tenderness. Left testis is descended.  Musculoskeletal: He exhibits no edema.  Lymphadenopathy:    He has no cervical adenopathy.  Neurological:  He is alert and oriented to person, place, and time. He has normal reflexes. No cranial nerve deficit. He exhibits normal muscle tone.  Skin: Skin is warm and dry. No rash noted. He is not diaphoretic. No erythema.  Psychiatric: He has a normal mood and affect. His behavior is normal.      Results for orders placed in visit on 03/14/13  IFOBT (OCCULT BLOOD)      Result Value Range   IFOBT Negative      Assessment & Plan:  Routine general medical examination at a health care facility - Plan: Ambulatory referral to Gastroenterology for screening colonoscopy, TSH, PSA, Lipid panel, CBC with Differential, Comprehensive metabolic panel, IFOBT POC (occult bld, rslt in office). Reviewed the importance of monthly self-testicular exams.  Need for Tdap vaccination - Plan: Tdap vaccine greater than or equal to 7yo IM  No orders of the defined types were placed in this encounter.   Will renew meds after labs come back if labs are w/in range.

## 2013-03-15 ENCOUNTER — Other Ambulatory Visit: Payer: Self-pay | Admitting: Family Medicine

## 2013-03-15 DIAGNOSIS — E039 Hypothyroidism, unspecified: Secondary | ICD-10-CM

## 2013-03-15 DIAGNOSIS — E785 Hyperlipidemia, unspecified: Secondary | ICD-10-CM

## 2013-03-15 DIAGNOSIS — I10 Essential (primary) hypertension: Secondary | ICD-10-CM

## 2013-03-15 LAB — LIPID PANEL
Cholesterol: 140 mg/dL (ref 0–200)
HDL: 51 mg/dL (ref >39)
LDL Cholesterol: 77 mg/dL (ref 0–99)
Total CHOL/HDL Ratio: 2.7 Ratio
Triglycerides: 61 mg/dL (ref <150)
VLDL: 12 mg/dL (ref 0–40)

## 2013-03-15 LAB — COMPREHENSIVE METABOLIC PANEL
ALT: 25 U/L (ref 0–53)
AST: 17 U/L (ref 0–37)
Albumin: 4.4 g/dL (ref 3.5–5.2)
Alkaline Phosphatase: 91 U/L (ref 39–117)
BUN: 21 mg/dL (ref 6–23)
CO2: 28 mEq/L (ref 19–32)
Calcium: 9.8 mg/dL (ref 8.4–10.5)
Chloride: 105 mEq/L (ref 96–112)
Creat: 1.16 mg/dL (ref 0.50–1.35)
Glucose, Bld: 75 mg/dL (ref 70–99)
Potassium: 4.1 mEq/L (ref 3.5–5.3)
Sodium: 140 mEq/L (ref 135–145)
Total Bilirubin: 0.6 mg/dL (ref 0.3–1.2)
Total Protein: 7.1 g/dL (ref 6.0–8.3)

## 2013-03-15 LAB — PSA: PSA: 1.54 ng/mL (ref <=4.00)

## 2013-03-15 LAB — TSH: TSH: 2.159 u[IU]/mL (ref 0.350–4.500)

## 2013-03-15 MED ORDER — SIMVASTATIN 20 MG PO TABS: 20.0000 mg | ORAL_TABLET | Freq: Every evening | ORAL | Status: DC

## 2013-03-15 MED ORDER — LISINOPRIL 20 MG PO TABS: 20.0000 mg | ORAL_TABLET | Freq: Every day | ORAL | Status: DC

## 2013-03-15 MED ORDER — LEVOTHYROXINE SODIUM 88 MCG PO TABS: 88.0000 ug | ORAL_TABLET | Freq: Every day | ORAL | Status: DC

## 2013-03-15 MED ORDER — DILTIAZEM HCL ER BEADS 240 MG PO CP24: 240.0000 mg | ORAL_CAPSULE | Freq: Every day | ORAL | Status: DC

## 2013-05-20 ENCOUNTER — Ambulatory Visit (INDEPENDENT_AMBULATORY_CARE_PROVIDER_SITE_OTHER): Payer: BC Managed Care – PPO | Admitting: Family Medicine

## 2013-05-20 VITALS — BP 170/92 | HR 53 | Temp 98.0°F | Resp 16 | Ht 64.5 in | Wt 149.6 lb

## 2013-05-20 DIAGNOSIS — L980 Pyogenic granuloma: Secondary | ICD-10-CM

## 2013-05-20 DIAGNOSIS — L989 Disorder of the skin and subcutaneous tissue, unspecified: Secondary | ICD-10-CM

## 2013-05-20 DIAGNOSIS — R52 Pain, unspecified: Secondary | ICD-10-CM

## 2013-05-20 NOTE — Progress Notes (Signed)
  Subjective:    Patient ID: James [name], male    DOB: 08/08/1961, 52 y.o.   MRN: [id number]  HPI Pt here today because he noticed a lesion on his abdomen. He states it was bleeding and very sore. Patient has very poor eyesight and has not been able to examine this area but he thinks is a wart.    Review of Systems No recent trauma to abdomen, no fever. There is no significant tenderness.    Objective:   Physical Exam  Patient has a half centimeter, round verrucous lesion which bleeds freely.  The area was prepped with iodine and then a 1 cm elliptical incision was carried out. The lesion in question was excised and handed off to biopsy specimen. 4-0 ethilon was used to close excision site using horizontal mattress and simple sutures.      Assessment & Plan:  Pyogenic granuloma  Plan: Recheck in one week, surgical biopsy Signed, Elvina Sidle

## 2013-05-22 ENCOUNTER — Telehealth: Payer: Self-pay

## 2013-05-22 NOTE — Telephone Encounter (Signed)
Pt called requesting instructions for suture care.

## 2013-05-22 NOTE — Telephone Encounter (Signed)
Left message for rtn call. 

## 2013-05-23 NOTE — Telephone Encounter (Signed)
Called again, no answer

## 2013-05-27 ENCOUNTER — Ambulatory Visit (INDEPENDENT_AMBULATORY_CARE_PROVIDER_SITE_OTHER): Payer: BC Managed Care – PPO | Admitting: Physician Assistant

## 2013-05-27 VITALS — BP 118/76 | HR 60 | Temp 99.0°F | Resp 16

## 2013-05-27 DIAGNOSIS — Z5189 Encounter for other specified aftercare: Secondary | ICD-10-CM

## 2013-05-27 DIAGNOSIS — S2190XD Unspecified open wound of unspecified part of thorax, subsequent encounter: Secondary | ICD-10-CM

## 2013-05-27 NOTE — Progress Notes (Signed)
  Subjective:    Patient ID: James [name], male    DOB: [birth year], 52 y.o.   MRN: [id number]  HPI 52 year old male presents for suture removal.  Had a pyogenic granuloma removed on 05/20/13.  Diagnosis confirmed by pathology.  Wound is doing well. No erythema, warmth, or drainage.  Patient is not having any issues or complaints. Otherwise doing well with no other concerns today.     Review of Systems  Constitutional: Negative for fever and chills.  Skin: Positive for wound.       Objective:   Physical Exam  Constitutional: He is oriented to person, place, and time. He appears well-developed and well-nourished.  HENT:  Head: Normocephalic and atraumatic.  Right Ear: External ear normal.  Left Ear: External ear normal.  Eyes: Conjunctivae are normal.  Neck: Normal range of motion.  Cardiovascular: Normal rate.   Pulmonary/Chest: Effort normal.  Neurological: He is alert and oriented to person, place, and time.  Skin:     Psychiatric: He has a normal mood and affect. His behavior is normal. Judgment and thought content normal.          Assessment & Plan:  Wound, open, trunk, subsequent encounter  Sutures removed.  Wound well healing - erythema likely inflammatory reaction. No evidence of infection Follow up as needed.

## 2013-06-01 ENCOUNTER — Other Ambulatory Visit: Payer: Self-pay | Admitting: Family Medicine

## 2013-07-08 ENCOUNTER — Other Ambulatory Visit: Payer: Self-pay | Admitting: Family Medicine

## 2013-07-08 ENCOUNTER — Ambulatory Visit: Payer: BC Managed Care – PPO | Admitting: Family Medicine

## 2013-07-08 DIAGNOSIS — E039 Hypothyroidism, unspecified: Secondary | ICD-10-CM

## 2013-07-08 MED ORDER — LEVOTHYROXINE SODIUM 88 MCG PO TABS: 88.0000 ug | ORAL_TABLET | Freq: Every day | ORAL | Status: DC

## 2013-07-08 NOTE — Telephone Encounter (Signed)
Patient came in office stating pharmacy would not fill levothyroxine because no refills remaining. The original rx was sent to [facility]. He has since changed to [facility] on [address]. Since 5 refills were given in June I sent in rx to [facility] with 2 refills.

## 2013-08-13 ENCOUNTER — Telehealth: Payer: Self-pay

## 2013-08-13 ENCOUNTER — Other Ambulatory Visit: Payer: Self-pay

## 2013-08-13 DIAGNOSIS — I10 Essential (primary) hypertension: Secondary | ICD-10-CM

## 2013-08-13 MED ORDER — DILTIAZEM HCL ER BEADS 240 MG PO CP24: 240.0000 mg | ORAL_CAPSULE | Freq: Every day | ORAL | Status: DC

## 2013-08-13 NOTE — Telephone Encounter (Signed)
Tiazac was sent today.

## 2013-08-13 NOTE — Telephone Encounter (Signed)
Pt is calling because he states that [facility] [address] has faxed two times to dr Clelia Croft a refill on his Tiazac He doesn't have a phone so he will call back tomorrow

## 2013-09-07 ENCOUNTER — Other Ambulatory Visit: Payer: Self-pay | Admitting: Family Medicine

## 2013-10-04 ENCOUNTER — Other Ambulatory Visit: Payer: Self-pay | Admitting: Family Medicine

## 2013-10-04 NOTE — Telephone Encounter (Signed)
UNABLE TO CONTACT PT- PLEASE ADVISE AT PHARMACY

## 2013-10-13 ENCOUNTER — Ambulatory Visit (INDEPENDENT_AMBULATORY_CARE_PROVIDER_SITE_OTHER): Payer: BC Managed Care – PPO | Admitting: Family Medicine

## 2013-10-13 ENCOUNTER — Telehealth: Payer: Self-pay

## 2013-10-13 VITALS — BP 132/100 | HR 55 | Temp 98.0°F | Resp 18 | Wt 147.0 lb

## 2013-10-13 DIAGNOSIS — Z79899 Other long term (current) drug therapy: Secondary | ICD-10-CM

## 2013-10-13 DIAGNOSIS — E785 Hyperlipidemia, unspecified: Secondary | ICD-10-CM

## 2013-10-13 DIAGNOSIS — I10 Essential (primary) hypertension: Secondary | ICD-10-CM

## 2013-10-13 DIAGNOSIS — R079 Chest pain, unspecified: Secondary | ICD-10-CM

## 2013-10-13 DIAGNOSIS — E039 Hypothyroidism, unspecified: Secondary | ICD-10-CM

## 2013-10-13 MED ORDER — SIMVASTATIN 20 MG PO TABS: 20.0000 mg | ORAL_TABLET | Freq: Every evening | ORAL | Status: DC

## 2013-10-13 MED ORDER — DILTIAZEM HCL ER BEADS 240 MG PO CP24: 240.0000 mg | ORAL_CAPSULE | Freq: Every day | ORAL | Status: DC

## 2013-10-13 MED ORDER — LEVOTHYROXINE SODIUM 88 MCG PO TABS: ORAL_TABLET | ORAL | Status: DC

## 2013-10-13 MED ORDER — LISINOPRIL 20 MG PO TABS: 20.0000 mg | ORAL_TABLET | Freq: Every day | ORAL | Status: DC

## 2013-10-13 NOTE — Telephone Encounter (Signed)
Patient calling to get a refill on his blood pressure medication for tiazac. Patient see's Dr. Clelia CroftShaw. He says it has been 3 days and has not heard back from our office, he says pharmacy has sent the request several times already. Please advise if rx can be filled.  Pharmacy: [facility] [address]   Best: [phone]

## 2013-10-13 NOTE — Telephone Encounter (Signed)
Tried to leave message, but this number is disconnected.

## 2013-10-13 NOTE — Telephone Encounter (Signed)
Sent in. Called wife, to advise he is due for follow up. Left message.

## 2013-10-13 NOTE — Patient Instructions (Signed)
Record your blood pressure when you check it at the pharmacy and bring it to your next office visit this summer.  Remember to be fasting at your next office visit.  DASH Diet The DASH diet stands for "Dietary Approaches to Stop Hypertension." It is a healthy eating plan that has been shown to reduce high blood pressure (hypertension) in as little as 14 days, while also possibly providing other significant health benefits. These other health benefits include reducing the risk of breast cancer after menopause and reducing the risk of type 2 diabetes, heart disease, colon cancer, and stroke. Health benefits also include weight loss and slowing kidney failure in patients with chronic kidney disease.  DIET GUIDELINES  Limit salt (sodium). Your diet should contain less than 1500 mg of sodium daily.  Limit refined or processed carbohydrates. Your diet should include mostly whole grains. Desserts and added sugars should be used sparingly.  Include small amounts of heart-healthy fats. These types of fats include nuts, oils, and tub margarine. Limit saturated and trans fats. These fats have been shown to be harmful in the body. CHOOSING FOODS  The following food groups are based on a 2000 calorie diet. See your Registered Dietitian for individual calorie needs. Grains and Grain Products (6 to 8 servings daily)  Eat More Often: Whole-wheat bread, brown rice, whole-grain or wheat pasta, quinoa, popcorn without added fat or salt (air popped).  Eat Less Often: White bread, white pasta, white rice, cornbread. Vegetables (4 to 5 servings daily)  Eat More Often: Fresh, frozen, and canned vegetables. Vegetables may be raw, steamed, roasted, or grilled with a minimal amount of fat.  Eat Less Often/Avoid: Creamed or fried vegetables. Vegetables in a cheese sauce. Fruit (4 to 5 servings daily)  Eat More Often: All fresh, canned (in natural juice), or frozen fruits. Dried fruits without added sugar. One hundred  percent fruit juice ( cup [237 mL] daily).  Eat Less Often: Dried fruits with added sugar. Canned fruit in light or heavy syrup. Foot LockerLean Meats, Fish, and Poultry (2 servings or less daily. One serving is 3 to 4 oz [85-114 g]).  Eat More Often: Ninety percent or leaner ground beef, tenderloin, sirloin. Round cuts of beef, chicken breast, [place]key breast. All fish. Grill, bake, or broil your meat. Nothing should be fried.  Eat Less Often/Avoid: Fatty cuts of meat, [place]key, or chicken leg, thigh, or wing. Fried cuts of meat or fish. Dairy (2 to 3 servings)  Eat More Often: Low-fat or fat-free milk, low-fat plain or light yogurt, reduced-fat or part-skim cheese.  Eat Less Often/Avoid: Milk (whole, 2%).Whole milk yogurt. Full-fat cheeses. Nuts, Seeds, and Legumes (4 to 5 servings per week)  Eat More Often: All without added salt.  Eat Less Often/Avoid: Salted nuts and seeds, canned beans with added salt. Fats and Sweets (limited)  Eat More Often: Vegetable oils, tub margarines without trans fats, sugar-free gelatin. Mayonnaise and salad dressings.  Eat Less Often/Avoid: Coconut oils, palm oils, butter, stick margarine, cream, half and half, cookies, candy, pie. FOR MORE INFORMATION The Dash Diet Eating Plan: www.dashdiet.org Document Released: 09/14/2011 Document Revised: 12/18/2011 Document Reviewed: 09/14/2011 [facility] Patient Information 2014 [place], [place].

## 2013-10-13 NOTE — Progress Notes (Signed)
Subjective:    Patient ID: James [name], male    DOB: 03-25-1961, 53 y.o.   MRN: [id number] Chief Complaint  Patient presents with  . Hypertension    recheck    HPI This chart was scribed for Esmond Camper, by Ladona Ridgel Day, Scribe. This patient was seen in room 5 and the patient's care was started at 6:57 PM.  HPI Comments: James [name] is a 53 y.o. male who presents to the [facility] for update of his blood pressure medication of Tiazac. He last had his medicine filled 2 months ago. He reports that his BP has not been running as hight as it is today in the office (132/100). He reports recently checked his BP at [facility]: 130/84.   He states has not eaten since this AM about 12 hours ago but had diet coke  He states has been exercising by doing cardio recently and has been noticing that sometimes he gets CP. He reports last episode while at rest about a month ago which felt like sudden onset stabbing pain (deep pain) that lasted only a few seconds. He denies any SOB or diaphoresis. He reports that he uses a bike to get around and had not CP today when biked to come to the office. Denies swelling in legs, denies light headiness or near syncope.   Patient Active Problem List   Diagnosis Date Noted  . Hypothyroid 12/23/2012  . Essential hypertension, benign 12/23/2012  . Other and unspecified hyperlipidemia 12/23/2012  . Encounter for long-term (current) use of other medications 12/23/2012    Past Surgical History  Procedure Laterality Date  . Fracture surgery    . Wisdom tooth extraction    . Thyroid surgery  2006    Family History  Problem Relation Age of Onset  . Mental illness Mother   . Hypertension Father   . Hypertension Maternal Grandmother   . Hypertension Paternal Grandmother   . Heart disease Paternal Grandfather     History   Social History  . Marital Status: Single    Spouse Name: N/A    Number of Children: N/A  . Years of  Education: N/A   Occupational History  . Not on file.   Social History Main Topics  . Smoking status: Never Smoker   . Smokeless tobacco: Not on file  . Alcohol Use: No  . Drug Use: No  . Sexual Activity: No   Other Topics Concern  . Not on file   Social History Narrative  . No narrative on file   No Known Allergies   Review of Systems  Constitutional: Negative for fever, chills, diaphoresis, activity change and appetite change.  HENT: Negative for congestion.   Eyes: Negative for visual disturbance.  Respiratory: Negative for cough and shortness of breath.   Cardiovascular: Positive for chest pain ([place] CP that lasts for a few seconds at rest). Negative for palpitations and leg swelling.  Neurological: Negative for dizziness, seizures, syncope, speech difficulty, light-headedness and headaches.      Objective:   Physical Exam  Nursing note and vitals reviewed. Constitutional: He is oriented to person, place, and time. He appears well-developed and well-nourished. No distress.  HENT:  Head: Normocephalic and atraumatic.  Eyes: Conjunctivae are normal. Right eye exhibits no discharge. Left eye exhibits no discharge.  Neck: Normal range of motion.  Cardiovascular: Normal rate, regular rhythm and normal heart sounds.   No murmur heard. Pulmonary/Chest: Effort normal. No respiratory distress.  Musculoskeletal: Normal range of motion. He exhibits no edema.  Neurological: He is alert and oriented to person, place, and time.  Skin: Skin is warm and dry.  Psychiatric: He has a normal mood and affect. Thought content normal.   Triage Vitals: BP 132/100  Pulse 55  Temp(Src) 98 F (36.7 C) (Oral)  Resp 18  Wt 147 lb (66.679 kg)  SpO2 100%    EKG: NSR, no acute ischemic change. Assessment & Plan:  Other and unspecified hyperlipidemia - Plan: EKG 12-Lead, Comprehensive metabolic panel, TSH, simvastatin (ZOCOR) 20 MG tablet  Hypothyroid - Plan: EKG 12-Lead,  Comprehensive metabolic panel, TSH  Essential hypertension, benign - Plan: EKG 12-Lead, Comprehensive metabolic panel, TSH, lisinopril (PRINIVIL,ZESTRIL) 20 MG tablet, diltiazem (TIAZAC) 240 MG 24 hr capsule - restart dilt, cont other meds  Encounter for long-term (current) use of other medications - Plan: EKG 12-Lead, Comprehensive metabolic panel, TSH  Chest pain - Plan: EKG 12-Lead, Comprehensive metabolic panel, TSH - very atypical. Restart dilt then if cont, RTC for further eval.  Meds ordered this encounter  Medications  . simvastatin (ZOCOR) 20 MG tablet    Sig: Take 1 tablet (20 mg total) by mouth every evening.    Dispense:  30 tablet    Refill:  5  . lisinopril (PRINIVIL,ZESTRIL) 20 MG tablet    Sig: Take 1 tablet (20 mg total) by mouth daily.    Dispense:  30 tablet    Refill:  5  . levothyroxine (SYNTHROID, LEVOTHROID) 88 MCG tablet    Sig: TAKE ONE TABLET BY MOUTH ONCE DAILY    Dispense:  30 tablet    Refill:  5  . diltiazem (TIAZAC) 240 MG 24 hr capsule    Sig: Take 1 capsule (240 mg total) by mouth daily.    Dispense:  30 capsule    Refill:  5    I personally performed the services described in this documentation, which was scribed in my presence. The recorded information has been reviewed and considered, and addended by me as needed.  Norberto SorensonEva Shaw, MD MPH

## 2013-10-14 LAB — COMPREHENSIVE METABOLIC PANEL
ALK PHOS: 66 U/L (ref 39–117)
ALT: 23 U/L (ref 0–53)
AST: 22 U/L (ref 0–37)
Albumin: 4.4 g/dL (ref 3.5–5.2)
BILIRUBIN TOTAL: 0.4 mg/dL (ref 0.3–1.2)
BUN: 17 mg/dL (ref 6–23)
CHLORIDE: 106 meq/L (ref 96–112)
CO2: 25 meq/L (ref 19–32)
Calcium: 9.2 mg/dL (ref 8.4–10.5)
Creat: 1.02 mg/dL (ref 0.50–1.35)
Glucose, Bld: 75 mg/dL (ref 70–99)
Potassium: 3.9 mEq/L (ref 3.5–5.3)
Sodium: 140 mEq/L (ref 135–145)
TOTAL PROTEIN: 7.1 g/dL (ref 6.0–8.3)

## 2013-10-14 LAB — TSH: TSH: 3.423 u[IU]/mL (ref 0.350–4.500)

## 2013-11-01 ENCOUNTER — Encounter: Payer: Self-pay | Admitting: Family Medicine

## 2013-11-11 ENCOUNTER — Other Ambulatory Visit: Payer: Self-pay | Admitting: Physician Assistant

## 2013-12-09 ENCOUNTER — Other Ambulatory Visit: Payer: Self-pay | Admitting: Family Medicine

## 2014-01-07 ENCOUNTER — Other Ambulatory Visit: Payer: Self-pay | Admitting: Family Medicine

## 2014-01-10 ENCOUNTER — Other Ambulatory Visit: Payer: Self-pay | Admitting: Family Medicine

## 2014-01-12 ENCOUNTER — Other Ambulatory Visit: Payer: Self-pay | Admitting: Family Medicine

## 2014-01-19 ENCOUNTER — Telehealth: Payer: Self-pay

## 2014-01-19 NOTE — Telephone Encounter (Signed)
Patient wants samples; he says he got them before from Dr. Clelia CroftShaw. He says he has refills left but he wanted to ask if we had samples so he can :"be on track". Please advise; I told him i do not know if we carry samples of these or not for these medications:   levothyroxine (SYNTHROID, LEVOTHROID) 88 MCG tablet lisinopril (PRINIVIL,ZESTRIL) 20 MG tablet Cardizem cd- diltiazem 240 mg     [phone]

## 2014-01-19 NOTE — Telephone Encounter (Signed)
Tried to call twice. Both times after 5 rings it says your call did not go through. Pt was given 6 mos worth of meds in Jan. Should not need more. Was going to let him know that we don not have samples.

## 2014-01-20 NOTE — Telephone Encounter (Signed)
Tried both numbers unable to contact pt- HT has script with refills. We do not have samples in the office of this medication.

## 2014-02-13 ENCOUNTER — Encounter: Payer: Self-pay | Admitting: Family Medicine

## 2014-02-13 ENCOUNTER — Ambulatory Visit (INDEPENDENT_AMBULATORY_CARE_PROVIDER_SITE_OTHER): Payer: BC Managed Care – PPO | Admitting: Family Medicine

## 2014-02-13 VITALS — BP 137/88 | HR 58 | Temp 96.8°F | Resp 16 | Ht 64.5 in | Wt 152.0 lb

## 2014-02-13 DIAGNOSIS — E039 Hypothyroidism, unspecified: Secondary | ICD-10-CM

## 2014-02-13 DIAGNOSIS — Z79899 Other long term (current) drug therapy: Secondary | ICD-10-CM

## 2014-02-13 DIAGNOSIS — I10 Essential (primary) hypertension: Secondary | ICD-10-CM

## 2014-02-13 DIAGNOSIS — E785 Hyperlipidemia, unspecified: Secondary | ICD-10-CM

## 2014-02-13 LAB — LIPID PANEL
CHOL/HDL RATIO: 3.3 ratio
CHOLESTEROL: 178 mg/dL (ref 0–200)
HDL: 54 mg/dL (ref >39)
LDL CALC: 109 mg/dL — AB (ref 0–99)
TRIGLYCERIDES: 75 mg/dL (ref <150)
VLDL: 15 mg/dL (ref 0–40)

## 2014-02-13 LAB — COMPREHENSIVE METABOLIC PANEL
ALK PHOS: 65 U/L (ref 39–117)
ALT: 24 U/L (ref 0–53)
AST: 21 U/L (ref 0–37)
Albumin: 4.5 g/dL (ref 3.5–5.2)
BILIRUBIN TOTAL: 0.4 mg/dL (ref 0.2–1.2)
BUN: 20 mg/dL (ref 6–23)
CO2: 27 meq/L (ref 19–32)
CREATININE: 1.13 mg/dL (ref 0.50–1.35)
Calcium: 9.6 mg/dL (ref 8.4–10.5)
Chloride: 105 mEq/L (ref 96–112)
GLUCOSE: 84 mg/dL (ref 70–99)
Potassium: 4.4 mEq/L (ref 3.5–5.3)
Sodium: 140 mEq/L (ref 135–145)
Total Protein: 7.3 g/dL (ref 6.0–8.3)

## 2014-02-13 MED ORDER — DILTIAZEM HCL ER BEADS 240 MG PO CP24: 240.0000 mg | ORAL_CAPSULE | Freq: Every day | ORAL | Status: DC

## 2014-02-13 MED ORDER — LISINOPRIL 40 MG PO TABS: 40.0000 mg | ORAL_TABLET | Freq: Every day | ORAL | Status: DC

## 2014-02-13 MED ORDER — LEVOTHYROXINE SODIUM 88 MCG PO TABS: ORAL_TABLET | ORAL | Status: DC

## 2014-02-13 NOTE — Progress Notes (Signed)
Subjective:    Patient ID: James [name], male    DOB: 03/30/1961, 53 y.o.   MRN: [id number] Chief Complaint  Patient presents with  . Follow-up    labs   HPI  [name] is overall doing well.   [facility] - running about the same - 130s/high 80s.  No further episodes of CP. Eating salt but tried not to add to much. Still rides his bike everywhere but not getting much other exercise.  Ran out of his levothyroxine x 2 wks - restarted it about 10d ago on 4/27.  Very upset because he had to pay out of pocket ($10 for 90d supply) but for some reason it didn't contribute to his insurance deductible because he doesn't have authorization - he wonders if we can help fix this for him.  Past Medical History  Diagnosis Date  . Allergy   . Substance abuse   . Thyroid disease   . Hypertension    Current Outpatient Prescriptions on File Prior to Visit  Medication Sig Dispense Refill  . simvastatin (ZOCOR) 20 MG tablet TAKE 1 TABLET (20 MG TOTAL) BY MOUTH EVERY EVENING.  30 tablet  1   No current facility-administered medications on file prior to visit.   No Known Allergies   Review of Systems  Constitutional: Negative for fever, chills, activity change, appetite change, fatigue and unexpected weight change.  Eyes: Negative for visual disturbance.  Respiratory: Negative for shortness of breath.   Cardiovascular: Negative for chest pain, palpitations and leg swelling.  Gastrointestinal: Negative for diarrhea and constipation.  Skin:       No changes in hair or skin  Neurological: Negative for dizziness, syncope, facial asymmetry, weakness, light-headedness and headaches.  Psychiatric/Behavioral:       No changes in mood      BP 137/88  Pulse 58  Temp(Src) 96.8 F (36 C)  Resp 16  Ht 5' 4.5" (1.638 m)  Wt 152 lb (68.947 kg)  BMI 25.70 kg/m2  SpO2 96% Objective:   Physical Exam  Constitutional: He is oriented to person, place, and time. He appears well-developed  and well-nourished. No distress.  HENT:  Head: Normocephalic and atraumatic.  Eyes: Conjunctivae are normal. Pupils are equal, round, and reactive to light. No scleral icterus.  Neck: Normal range of motion. Neck supple. No thyromegaly present.  Cardiovascular: Normal rate, regular rhythm, normal heart sounds and intact distal pulses.   Pulmonary/Chest: Effort normal and breath sounds normal. No respiratory distress.  Musculoskeletal: He exhibits no edema.  Lymphadenopathy:    He has no cervical adenopathy.  Neurological: He is alert and oriented to person, place, and time.  Skin: Skin is warm and dry. He is not diaphoretic.  Psychiatric: He has a normal mood and affect. His behavior is normal.          Assessment & Plan:  Other and unspecified hyperlipidemia - Plan: Lipid panel - on simvastatin 20, fasting labs today  Hypothyroid - Plan: CANCELED: TSH - will not check tsh since off of med x 2 wks - recheck at f/u in 3-4 mos  Essential hypertension, benign - Plan: Comprehensive metabolic panel, lisinopril (PRINIVIL,ZESTRIL) 40 MG tablet, diltiazem (TIAZAC) 240 MG 24 hr capsule - not quite at goal, increase lisinopril from 20 to 40 and cont dilt. Recheck in 3-4 mos w/ repeat bmp  Encounter for long-term (current) use of other medications - Plan: Comprehensive metabolic panel  Meds ordered this encounter  Medications  .  cetirizine (ZYRTEC) 10 MG tablet    Sig: Take 10 mg by mouth daily.  Marland Kitchen. lisinopril (PRINIVIL,ZESTRIL) 40 MG tablet    Sig: Take 1 tablet (40 mg total) by mouth daily.    Dispense:  30 tablet    Refill:  5  . diltiazem (TIAZAC) 240 MG 24 hr capsule    Sig: Take 1 capsule (240 mg total) by mouth daily.    Dispense:  30 capsule    Refill:  5  . levothyroxine (SYNTHROID, LEVOTHROID) 88 MCG tablet    Sig: TAKE ONE TABLET BY MOUTH ONCE DAILY    Dispense:  30 tablet    Refill:  5   Norberto SorensonEva Taksh Hjort, MD MPH

## 2014-03-08 ENCOUNTER — Encounter: Payer: Self-pay | Admitting: Family Medicine

## 2014-03-08 ENCOUNTER — Telehealth: Payer: Self-pay | Admitting: Family Medicine

## 2014-03-08 NOTE — Telephone Encounter (Signed)
Pt came in office today requesting someone to call him with his lab results from 02/13/14.   Pt callback # [phone]

## 2014-03-10 ENCOUNTER — Telehealth: Payer: Self-pay

## 2014-03-10 NOTE — Telephone Encounter (Signed)
Patient called for labs, reviewed lab with patient.  Patient states understanding.

## 2014-04-04 ENCOUNTER — Other Ambulatory Visit: Payer: Self-pay | Admitting: Family Medicine

## 2014-05-03 ENCOUNTER — Other Ambulatory Visit: Payer: Self-pay | Admitting: Family Medicine

## 2014-05-18 ENCOUNTER — Telehealth: Payer: Self-pay

## 2014-05-18 DIAGNOSIS — I10 Essential (primary) hypertension: Secondary | ICD-10-CM

## 2014-05-18 NOTE — Telephone Encounter (Signed)
Pt had appt with dr Clelia Croftshaw on 05/22/14. The clinic was cancelled by the provider. Pt is rescheduled for 06/19/14. Pt worried he will run out of medications before appointment and requests refills.   Bf

## 2014-05-19 MED ORDER — LEVOTHYROXINE SODIUM 88 MCG PO TABS: ORAL_TABLET | ORAL | Status: DC

## 2014-05-19 MED ORDER — SIMVASTATIN 20 MG PO TABS: ORAL_TABLET | ORAL | Status: DC

## 2014-05-19 MED ORDER — LISINOPRIL 40 MG PO TABS: 40.0000 mg | ORAL_TABLET | Freq: Every day | ORAL | Status: DC

## 2014-05-19 MED ORDER — DILTIAZEM HCL ER COATED BEADS 240 MG PO CP24: ORAL_CAPSULE | ORAL | Status: DC

## 2014-05-19 NOTE — Telephone Encounter (Signed)
[facility]. Sent refills. Pt advised.

## 2014-05-22 ENCOUNTER — Ambulatory Visit: Payer: BC Managed Care – PPO | Admitting: Family Medicine

## 2014-05-24 ENCOUNTER — Other Ambulatory Visit: Payer: Self-pay | Admitting: Family Medicine

## 2014-05-29 ENCOUNTER — Other Ambulatory Visit: Payer: Self-pay | Admitting: Family Medicine

## 2014-06-19 ENCOUNTER — Ambulatory Visit (INDEPENDENT_AMBULATORY_CARE_PROVIDER_SITE_OTHER): Payer: BC Managed Care – PPO | Admitting: Family Medicine

## 2014-06-19 ENCOUNTER — Encounter: Payer: Self-pay | Admitting: Family Medicine

## 2014-06-19 VITALS — BP 156/100 | HR 65 | Temp 98.4°F | Resp 16 | Ht 64.5 in | Wt 150.2 lb

## 2014-06-19 DIAGNOSIS — I10 Essential (primary) hypertension: Secondary | ICD-10-CM

## 2014-06-19 DIAGNOSIS — Z79899 Other long term (current) drug therapy: Secondary | ICD-10-CM

## 2014-06-19 DIAGNOSIS — E039 Hypothyroidism, unspecified: Secondary | ICD-10-CM

## 2014-06-19 DIAGNOSIS — E785 Hyperlipidemia, unspecified: Secondary | ICD-10-CM

## 2014-06-19 LAB — BASIC METABOLIC PANEL
BUN: 11 mg/dL (ref 6–23)
CALCIUM: 9.4 mg/dL (ref 8.4–10.5)
CO2: 27 meq/L (ref 19–32)
CREATININE: 1.12 mg/dL (ref 0.50–1.35)
Chloride: 108 mEq/L (ref 96–112)
GLUCOSE: 91 mg/dL (ref 70–99)
Potassium: 4.3 mEq/L (ref 3.5–5.3)
Sodium: 141 mEq/L (ref 135–145)

## 2014-06-19 LAB — TSH: TSH: 3.862 u[IU]/mL (ref 0.350–4.500)

## 2014-06-19 MED ORDER — LISINOPRIL 40 MG PO TABS: 40.0000 mg | ORAL_TABLET | Freq: Every day | ORAL | Status: DC

## 2014-06-19 MED ORDER — SIMVASTATIN 20 MG PO TABS: ORAL_TABLET | ORAL | Status: DC

## 2014-06-19 NOTE — Patient Instructions (Signed)
DASH Eating Plan °DASH stands for "Dietary Approaches to Stop Hypertension." The DASH eating plan is a healthy eating plan that has been shown to reduce high blood pressure (hypertension). Additional health benefits may include reducing the risk of type 2 diabetes mellitus, heart disease, and stroke. The DASH eating plan may also help with weight loss. °WHAT DO I NEED TO KNOW ABOUT THE DASH EATING PLAN? °For the DASH eating plan, you will follow these general guidelines: °· Choose foods with a percent daily value for sodium of less than 5% (as listed on the food label). °· Use salt-free seasonings or herbs instead of table salt or sea salt. °· Check with your health care provider or pharmacist before using salt substitutes. °· Eat lower-sodium products, often labeled as "lower sodium" or "no salt added." °· Eat fresh foods. °· Eat more vegetables, fruits, and low-fat dairy products. °· Choose whole grains. Look for the word "whole" as the first word in the ingredient list. °· Choose fish and skinless chicken or turkey more often than red meat. Limit fish, poultry, and meat to 6 oz (170 g) each day. °· Limit sweets, desserts, sugars, and sugary drinks. °· Choose heart-healthy fats. °· Limit cheese to 1 oz (28 g) per day. °· Eat more home-cooked food and less restaurant, buffet, and fast food. °· Limit fried foods. °· Cook foods using methods other than frying. °· Limit canned vegetables. If you do use them, rinse them well to decrease the sodium. °· When eating at a restaurant, ask that your food be prepared with less salt, or no salt if possible. °WHAT FOODS CAN I EAT? °Seek help from a dietitian for individual calorie needs. °Grains °Whole grain or whole wheat bread. Brown rice. Whole grain or whole wheat pasta. Quinoa, bulgur, and whole grain cereals. Low-sodium cereals. Corn or whole wheat flour tortillas. Whole grain cornbread. Whole grain crackers. Low-sodium crackers. °Vegetables °Fresh or frozen vegetables  (raw, steamed, roasted, or grilled). Low-sodium or reduced-sodium tomato and vegetable juices. Low-sodium or reduced-sodium tomato sauce and paste. Low-sodium or reduced-sodium canned vegetables.  °Fruits °All fresh, canned (in natural juice), or frozen fruits. °Meat and Other Protein Products °Ground beef (85% or leaner), grass-fed beef, or beef trimmed of fat. Skinless chicken or turkey. Ground chicken or turkey. Pork trimmed of fat. All fish and seafood. Eggs. Dried beans, peas, or lentils. Unsalted nuts and seeds. Unsalted canned beans. °Dairy °Low-fat dairy products, such as skim or 1% milk, 2% or reduced-fat cheeses, low-fat ricotta or cottage cheese, or plain low-fat yogurt. Low-sodium or reduced-sodium cheeses. °Fats and Oils °Tub margarines without trans fats. Light or reduced-fat mayonnaise and salad dressings (reduced sodium). Avocado. Safflower, olive, or canola oils. Natural peanut or almond butter. °Other °Unsalted popcorn and pretzels. °The items listed above may not be a complete list of recommended foods or beverages. Contact your dietitian for more options. °WHAT FOODS ARE NOT RECOMMENDED? °Grains °White bread. White pasta. White rice. Refined cornbread. Bagels and croissants. Crackers that contain trans fat. °Vegetables °Creamed or fried vegetables. Vegetables in a cheese sauce. Regular canned vegetables. Regular canned tomato sauce and paste. Regular tomato and vegetable juices. °Fruits °Dried fruits. Canned fruit in light or heavy syrup. Fruit juice. °Meat and Other Protein Products °Fatty cuts of meat. Ribs, chicken wings, bacon, sausage, bologna, salami, chitterlings, fatback, hot dogs, bratwurst, and packaged luncheon meats. Salted nuts and seeds. Canned beans with salt. °Dairy °Whole or 2% milk, cream, half-and-half, and cream cheese. Whole-fat or sweetened yogurt. Full-fat   cheeses or blue cheese. Nondairy creamers and whipped toppings. Processed cheese, cheese spreads, or cheese  curds. °Condiments °Onion and garlic salt, seasoned salt, table salt, and sea salt. Canned and packaged gravies. Worcestershire sauce. Tartar sauce. Barbecue sauce. Teriyaki sauce. Soy sauce, including reduced sodium. Steak sauce. Fish sauce. Oyster sauce. Cocktail sauce. Horseradish. Ketchup and mustard. Meat flavorings and tenderizers. Bouillon cubes. Hot sauce. Tabasco sauce. Marinades. Taco seasonings. Relishes. °Fats and Oils °Butter, stick margarine, lard, shortening, ghee, and bacon fat. Coconut, palm kernel, or palm oils. Regular salad dressings. °Other °Pickles and olives. Salted popcorn and pretzels. °The items listed above may not be a complete list of foods and beverages to avoid. Contact your dietitian for more information. °WHERE CAN I FIND MORE INFORMATION? °National Heart, Lung, and Blood Institute: www.nhlbi.nih.gov/health/health-topics/topics/dash/ °Document Released: 09/14/2011 Document Revised: 02/09/2014 Document Reviewed: 07/30/2013 °ExitCare® Patient Information ©2015 [employer], LLC. This information is not intended to replace advice given to you by your health care provider. Make sure you discuss any questions you have with your health care provider. ° °

## 2014-06-19 NOTE — Progress Notes (Signed)
Subjective:    Patient ID: James [name], male    DOB: September 10, 1961, 53 y.o.   MRN: [id number] This chart was scribed for Sherren Mocha, MD by Julian Hy, ED Scribe. The patient was seen in Room 24. The patient's care was started at 10:24 AM.   06/19/2014  Follow-up   HPI HPI Comments: James [name] is a 53 y.o. male who presents to the [facility] for follow-up.  Pt notes he did not take his BP medication this morning because he wasn't sure if he should come to the office visit without taking it. Pt denies checking his BP at [employer] regularly. Pt denies chest pain dizziness or palpitations.    Mr. [name] is here to have his thyroid recheck, it was unable to be checked at his last visit as he had been off his medication for several weeks. At last visit we increased his lisinopril from 20 to 40 mg and continued his diltiazem.    Review of Systems  Cardiovascular: Negative for chest pain and palpitations.  All other systems reviewed and are negative.   Past Medical History  Diagnosis Date  . Allergy   . Substance abuse   . Thyroid disease   . Hypertension    Past Surgical History  Procedure Laterality Date  . Fracture surgery    . Wisdom tooth extraction    . Thyroid surgery  2006   No Known Allergies Current Outpatient Prescriptions  Medication Sig Dispense Refill  . cetirizine (ZYRTEC) 10 MG tablet Take 10 mg by mouth daily.      Marland Kitchen diltiazem (CARTIA XT) 240 MG 24 hr capsule TAKE 1 CAPSULE (240 MG TOTAL) BY MOUTH DAILY.  30 capsule  0  . diltiazem (TIAZAC) 240 MG 24 hr capsule Take 1 capsule (240 mg total) by mouth daily.  30 capsule  5  . levothyroxine (SYNTHROID, LEVOTHROID) 88 MCG tablet TAKE ONE TABLET BY MOUTH ONCE DAILY  30 tablet  0  . levothyroxine (SYNTHROID, LEVOTHROID) 88 MCG tablet TAKE ONE TABLET BY MOUTH ONCE DAILY BEFORE BREALFAST  30 tablet  0  . lisinopril (PRINIVIL,ZESTRIL) 40 MG tablet Take 1 tablet (40 mg total) by mouth daily.   30 tablet  0  . simvastatin (ZOCOR) 20 MG tablet TAKE 1 TABLET (20 MG TOTAL) BY MOUTH EVERY EVENING.  30 tablet  0   No current facility-administered medications for this visit.       Objective:  Triage Vitals: BP 156/100  Pulse 65  Temp(Src) 98.4 F (36.9 C) (Oral)  Resp 16  Ht 5' 4.5" (1.638 m)  Wt 150 lb 3.2 oz (68.13 kg)  BMI 25.39 kg/m2  SpO2 97%  Physical Exam  Nursing note and vitals reviewed. Constitutional: He is oriented to person, place, and time. He appears well-developed and well-nourished. No distress.  HENT:  Head: Normocephalic and atraumatic.  Eyes: Conjunctivae and EOM are normal.  Neck: Neck supple. No tracheal deviation present. No thyromegaly present.  No thyroid nodules or masses.  Cardiovascular: Normal rate, regular rhythm and normal heart sounds.   Pulmonary/Chest: Effort normal and breath sounds normal. No respiratory distress.  Musculoskeletal: Normal range of motion.  Neurological: He is alert and oriented to person, place, and time.  Skin: Skin is warm and dry.  Psychiatric: He has a normal mood and affect. His behavior is normal.    Assessment & Plan:  10:28 AM- Patient informed of current plan for treatment and evaluation and  agrees with plan at this time.   Hypothyroidism, unspecified hypothyroidism type - Plan: TSH - continue same dose of levothyroxine as TSH nml today  Other and unspecified hyperlipidemia - cont on simvastatin.   Essential hypertension, benign - Plan: lisinopril (PRINIVIL,ZESTRIL) 40 MG tablet - Refill diltiazem after pt brings in BP readings as he may need dose increased.  BP sig elevated today but he didn't take any of his medications. He will continue on lisinopril.  Encounter for long-term (current) use of other medications - Plan: Basic metabolic panel  Meds ordered this encounter  Medications  . simvastatin (ZOCOR) 20 MG tablet    Sig: TAKE 1 TABLET (20 MG TOTAL) BY MOUTH EVERY EVENING.    Dispense:  90 tablet     Refill:  3  . lisinopril (PRINIVIL,ZESTRIL) 40 MG tablet    Sig: Take 1 tablet (40 mg total) by mouth daily.    Dispense:  90 tablet    Refill:  3    I personally performed the services described in this documentation, which was scribed in my presence. The recorded information has been reviewed and considered, and addended by me as needed.  Norberto Sorenson, MD MPH  Results for orders placed in visit on 06/19/14  TSH      Result Value Ref Range   TSH 3.862  0.350 - 4.500 uIU/mL  BASIC METABOLIC PANEL      Result Value Ref Range   Sodium 141  135 - 145 mEq/L   Potassium 4.3  3.5 - 5.3 mEq/L   Chloride 108  96 - 112 mEq/L   CO2 27  19 - 32 mEq/L   Glucose, Bld 91  70 - 99 mg/dL   BUN 11  6 - 23 mg/dL   Creat 1.61  0.96 - 0.45 mg/dL   Calcium 9.4  8.4 - 40.9 mg/dL

## 2014-06-23 MED ORDER — LEVOTHYROXINE SODIUM 88 MCG PO TABS: ORAL_TABLET | ORAL | Status: DC

## 2014-07-10 ENCOUNTER — Telehealth: Payer: Self-pay

## 2014-07-10 NOTE — Telephone Encounter (Signed)
Dr. Clelia CroftShaw, pt calling in regards to his labs from 9/11. Please review. Thanks

## 2014-07-13 ENCOUNTER — Encounter: Payer: Self-pay | Admitting: Family Medicine

## 2014-07-13 NOTE — Telephone Encounter (Signed)
Labs were all normal. Continue same medication doses. Letter sent.

## 2014-07-13 NOTE — Telephone Encounter (Signed)
LM- normal lab results

## 2014-07-21 ENCOUNTER — Telehealth: Payer: Self-pay | Admitting: *Deleted

## 2014-07-21 ENCOUNTER — Encounter: Payer: Self-pay | Admitting: *Deleted

## 2014-07-21 NOTE — Telephone Encounter (Signed)
Pt called in concerning lab results from sept 11th. I called and left a message in regards to these .

## 2014-10-08 ENCOUNTER — Other Ambulatory Visit: Payer: Self-pay

## 2014-10-08 MED ORDER — LEVOTHYROXINE SODIUM 88 MCG PO TABS: ORAL_TABLET | ORAL | Status: DC

## 2014-10-16 ENCOUNTER — Other Ambulatory Visit: Payer: Self-pay | Admitting: Family Medicine

## 2014-12-18 ENCOUNTER — Ambulatory Visit: Payer: BC Managed Care – PPO | Admitting: Family Medicine

## 2015-01-11 ENCOUNTER — Other Ambulatory Visit: Payer: Self-pay | Admitting: Family Medicine

## 2015-01-12 ENCOUNTER — Ambulatory Visit (INDEPENDENT_AMBULATORY_CARE_PROVIDER_SITE_OTHER): Payer: BLUE CROSS/BLUE SHIELD | Admitting: Family Medicine

## 2015-01-12 ENCOUNTER — Other Ambulatory Visit: Payer: Self-pay | Admitting: Family Medicine

## 2015-01-12 ENCOUNTER — Other Ambulatory Visit: Payer: Self-pay | Admitting: Physician Assistant

## 2015-01-12 VITALS — BP 150/88 | HR 69 | Temp 98.1°F | Resp 16 | Ht 65.25 in | Wt 141.0 lb

## 2015-01-12 DIAGNOSIS — I10 Essential (primary) hypertension: Secondary | ICD-10-CM | POA: Diagnosis not present

## 2015-01-12 DIAGNOSIS — D649 Anemia, unspecified: Secondary | ICD-10-CM

## 2015-01-12 LAB — POCT URINALYSIS DIPSTICK
BILIRUBIN UA: NEGATIVE
Blood, UA: NEGATIVE
GLUCOSE UA: NEGATIVE
Ketones, UA: NEGATIVE
LEUKOCYTES UA: NEGATIVE
Nitrite, UA: NEGATIVE
PH UA: 6
Protein, UA: NEGATIVE
Spec Grav, UA: 1.015
Urobilinogen, UA: 0.2

## 2015-01-12 LAB — POCT CBC
GRANULOCYTE PERCENT: 64.9 % (ref 37–80)
HEMATOCRIT: 40.6 % — AB (ref 43.5–53.7)
Hemoglobin: 13.1 g/dL — AB (ref 14.1–18.1)
Lymph, poc: 1.7 (ref 0.6–3.4)
MCH, POC: 28.2 pg (ref 27–31.2)
MCHC: 32.3 g/dL (ref 31.8–35.4)
MCV: 87.2 fL (ref 80–97)
MID (cbc): 0.5 (ref 0–0.9)
MPV: 7 fL (ref 0–99.8)
POC GRANULOCYTE: 4 (ref 2–6.9)
POC LYMPH %: 26.9 % (ref 10–50)
POC MID %: 8.2 %M (ref 0–12)
Platelet Count, POC: 331 10*3/uL (ref 142–424)
RBC: 4.66 M/uL — AB (ref 4.69–6.13)
RDW, POC: 13.9 %
WBC: 6.2 10*3/uL (ref 4.6–10.2)

## 2015-01-12 LAB — COMPREHENSIVE METABOLIC PANEL
ALBUMIN: 4.4 g/dL (ref 3.5–5.2)
ALT: 17 U/L (ref 0–53)
AST: 19 U/L (ref 0–37)
Alkaline Phosphatase: 80 U/L (ref 39–117)
BUN: 13 mg/dL (ref 6–23)
CALCIUM: 8.9 mg/dL (ref 8.4–10.5)
CHLORIDE: 103 meq/L (ref 96–112)
CO2: 27 meq/L (ref 19–32)
Creat: 1.02 mg/dL (ref 0.50–1.35)
Glucose, Bld: 79 mg/dL (ref 70–99)
POTASSIUM: 3.8 meq/L (ref 3.5–5.3)
SODIUM: 140 meq/L (ref 135–145)
TOTAL PROTEIN: 7.1 g/dL (ref 6.0–8.3)
Total Bilirubin: 0.4 mg/dL (ref 0.2–1.2)

## 2015-01-12 LAB — POCT UA - MICROSCOPIC ONLY
BACTERIA, U MICROSCOPIC: NEGATIVE
CASTS, UR, LPF, POC: NEGATIVE
CRYSTALS, UR, HPF, POC: NEGATIVE
EPITHELIAL CELLS, URINE PER MICROSCOPY: NEGATIVE
Mucus, UA: NEGATIVE
RBC, urine, microscopic: NEGATIVE
WBC, Ur, HPF, POC: NEGATIVE
Yeast, UA: NEGATIVE

## 2015-01-12 LAB — TSH: TSH: 7.223 u[IU]/mL — ABNORMAL HIGH (ref 0.350–4.500)

## 2015-01-12 MED ORDER — DILTIAZEM HCL ER BEADS 240 MG PO CP24: 240.0000 mg | ORAL_CAPSULE | Freq: Every day | ORAL | Status: DC

## 2015-01-12 NOTE — Progress Notes (Signed)
01/12/2015 at 2:43 PM  James [name] / DOB: June 03, 1961 / MRN: [id number]  The patient has Hypothyroid; Essential hypertension, benign; Other and unspecified hyperlipidemia; and Encounter for long-term (current) use of other medications on his problem list.  SUBJECTIVE  Chief complaint: Medication Refill   History of present illness: [name] is 54 y.o. well appearing male presenting for a hypertension follow up and medication refill.  He reports being compliant with his medications, aside from last week when he ran out of his diltiazem.  He denies HA, vision changes, chest pain, palpitations, dizziness, and changes in urination. He just refilled his lisinopril.   He  has a past medical history of Allergy; Substance abuse; Thyroid disease; and Hypertension.    He has a current medication list which includes the following prescription(s): cetirizine, diltiazem, levothyroxine, lisinopril, and simvastatin.  [name] has No Known Allergies. He  reports that he has never smoked. He does not have any smokeless tobacco history on file. He reports that he does not drink alcohol or use illicit drugs. He  reports that he does not engage in sexual activity. The patient  has past surgical history that includes Fracture surgery; Wisdom tooth extraction; and Thyroid surgery (2006).  His family history includes Heart disease in his paternal grandfather; Hypertension in his father, maternal grandmother, and paternal grandmother; Mental illness in his mother.  Review of Systems  Constitutional: Negative.   HENT: Negative.   Eyes: Negative.   Respiratory: Negative.   Cardiovascular: Negative.   Gastrointestinal: Negative.   Genitourinary: Negative.   Musculoskeletal: Negative.   Skin: Negative.   Neurological: Negative.     OBJECTIVE  His  height is 5' 5.25" (1.657 m) and weight is 141 lb (63.957 kg). His oral temperature is 98.1 F (36.7 C). His blood pressure is 150/88 and his pulse is 69. His  respiration is 16 and oxygen saturation is 98%.  The patient's body mass index is 23.29 kg/(m^2).  Physical Exam  Constitutional: He is oriented to person, place, and time. He appears well-developed and well-nourished. No distress.  Cardiovascular: Normal rate and regular rhythm.   Respiratory: Effort normal and breath sounds normal.  GI: Soft. Bowel sounds are normal.  Musculoskeletal: Normal range of motion.  Neurological: He is alert and oriented to person, place, and time. He has normal reflexes. He displays no atrophy. No cranial nerve deficit. Coordination and gait normal.  Skin: Skin is warm and dry. He is not diaphoretic.  Psychiatric:  He has a flat affect.    Results for orders placed or performed in visit on 01/12/15 (from the past 24 hour(s))  POCT CBC     Status: Abnormal   Collection Time: 01/12/15  2:29 PM  Result Value Ref Range   WBC 6.2 4.6 - 10.2 K/uL   Lymph, poc 1.7 0.6 - 3.4   POC LYMPH PERCENT 26.9 10 - 50 %L   MID (cbc) 0.5 0 - 0.9   POC MID % 8.2 0 - 12 %M   POC Granulocyte 4.0 2 - 6.9   Granulocyte percent 64.9 37 - 80 %G   RBC 4.66 (A) 4.69 - 6.13 M/uL   Hemoglobin 13.1 (A) 14.1 - 18.1 g/dL   HCT, POC 40.9 (A) 81.1 - 53.7 %   MCV 87.2 80 - 97 fL   MCH, POC 28.2 27 - 31.2 pg   MCHC 32.3 31.8 - 35.4 g/dL   RDW, POC 91.4 %   Platelet Count, POC 331 142 -  424 K/uL   MPV 7.0 0 - 99.8 fL  POCT urinalysis dipstick     Status: None   Collection Time: 01/12/15  2:29 PM  Result Value Ref Range   Color, UA lt. yellow    Clarity, UA clear    Glucose, UA neg    Bilirubin, UA neg    Ketones, UA neg    Spec Grav, UA 1.015    Blood, UA neg    pH, UA 6.0    Protein, UA neg    Urobilinogen, UA 0.2    Nitrite, UA neg    Leukocytes, UA Negative   POCT UA - Microscopic Only     Status: None   Collection Time: 01/12/15  2:29 PM  Result Value Ref Range   WBC, Ur, HPF, POC neg    RBC, urine, microscopic neg    Bacteria, U Microscopic neg    Mucus, UA neg     Epithelial cells, urine per micros neg    Crystals, Ur, HPF, POC neg    Casts, Ur, LPF, POC neg    Yeast, UA neg     ASSESSMENT & PLAN  [name] was seen today for medication refill.  Diagnoses and all orders for this visit:  Essential hypertension:  Orders: -     POCT CBC -     POCT urinalysis dipstick -     POCT UA - Microscopic Only -     Comprehensive metabolic panel -     TSH  Essential hypertension, benign Orders: -     diltiazem (TIAZAC) 240 MG 24 hr capsule; Take 1 capsule (240 mg total) by mouth daily. [facility] October 2016 for refills.  Normocytic anemia:       -     Peripheral smear    The patient was advised to call or come back to clinic if he does not see an improvement in symptoms, or worsens with the above plan.   Deliah BostonMichael Braley Luckenbaugh, MHS, PA-C [facility] 01/12/2015 2:43 PM

## 2015-01-14 ENCOUNTER — Other Ambulatory Visit: Payer: Self-pay

## 2015-01-14 ENCOUNTER — Other Ambulatory Visit: Payer: Self-pay | Admitting: Physician Assistant

## 2015-01-14 DIAGNOSIS — R7989 Other specified abnormal findings of blood chemistry: Secondary | ICD-10-CM

## 2015-01-14 MED ORDER — LEVOTHYROXINE SODIUM 100 MCG PO TABS: 100.0000 ug | ORAL_TABLET | Freq: Every day | ORAL | Status: DC

## 2015-01-14 NOTE — Telephone Encounter (Signed)
tsh mildly elev at 7 - will increase levothyroxine fro 88 to 100 - please have pt sched an OV in 6-8 wks for repeat labs.  Called pt and LVM about dose change - if he had NOT taken his dose for several days prior to labs OR he was taking at the same time as any food, drink, other meds or supplements, then we would want to leave him on the 88 mcg dose - either way, still needs to RTC for recheck in 6-8 wks - sched at 104 w/ myself of [place] and DO take all meds that mornings.

## 2015-01-14 NOTE — Telephone Encounter (Signed)
Please sched f/u at 104 in 6-8 wks w/ myself or clark , thanks.

## 2015-01-14 NOTE — Telephone Encounter (Signed)
Pharm reqs RF of levothyroxine 88 mcg. Dr Clelia CroftShaw, it looks like you haven't reviewed labs yet. Please do so and send in appropriate dosage of levothyroxine - it looks like it will need to be changed.

## 2015-01-14 NOTE — Progress Notes (Signed)
Reviewed documentation and agree w/ assessment and plan. TSH was mildly elev at 7 so increase levothyroxine from 88 to 100 and recheck in OV in 6-8 wks. Norberto SorensonEva Elbridge Magowan, MD MPH

## 2015-01-14 NOTE — Progress Notes (Signed)
Patient with elevated TSH on levothyroxine. Will add a lab only panel to further assess thyroid function.  Deliah BostonMichael Hewitt Garner, MS, PA-C   10:33 AM, 01/14/2015

## 2015-01-15 ENCOUNTER — Telehealth: Payer: Self-pay | Admitting: Family Medicine

## 2015-01-15 NOTE — Telephone Encounter (Signed)
Expand All Collapse All    tsh mildly elev at 7 - will increase levothyroxine fro 88 to 100 - please have pt sched an OV in 6-8 wks for repeat labs. Called pt and LVM about dose change - if he had NOT taken his dose for several days prior to labs OR he was taking at the same time as any food, drink, other meds or supplements, then we would want to leave him on the 88 mcg dose - either way, still needs to RTC for recheck in 6-8 wks - sched at 104 w/ myself of [place] and DO take all meds that mornings.    LMOM to call back

## 2015-01-15 NOTE — Telephone Encounter (Signed)
LMOM to CB. 

## 2015-01-15 NOTE — Telephone Encounter (Signed)
tsh mildly elev at 7 - will increase levothyroxine fro 88 to 100 - please have pt sched an OV in 6-8 wks for repeat labs. Called pt and LVM about dose change - if he had NOT taken his dose for several days prior to labs OR he was taking at the same time as any food, drink, other meds or supplements, then we would want to leave him on the 88 mcg dose - either way, still needs to RTC for recheck in 6-8 wks - sched at 104 w/ myself of [place] and DO take all meds that mornings  LMOM to give us a call back

## 2015-01-21 NOTE — Telephone Encounter (Signed)
Duplicate message. See notes under 01/15/15 enc.

## 2015-03-12 ENCOUNTER — Ambulatory Visit: Payer: BLUE CROSS/BLUE SHIELD | Admitting: Family Medicine

## 2015-05-07 ENCOUNTER — Telehealth: Payer: Self-pay | Admitting: Family Medicine

## 2015-05-07 DIAGNOSIS — E039 Hypothyroidism, unspecified: Secondary | ICD-10-CM

## 2015-05-07 MED ORDER — LEVOTHYROXINE SODIUM 100 MCG PO TABS: 100.0000 ug | ORAL_TABLET | Freq: Every day | ORAL | Status: DC

## 2015-05-07 NOTE — Telephone Encounter (Signed)
Can we refill? 

## 2015-05-07 NOTE — Telephone Encounter (Signed)
Patient request a refill on Levothyroxine 100 MCG. Patient stated he need about 10 pills until his next visit with Dr. Clelia Croft. [facility] on [address] [phone]

## 2015-05-07 NOTE — Telephone Encounter (Signed)
30 tabs sent to pharmacy

## 2015-05-11 ENCOUNTER — Other Ambulatory Visit: Payer: Self-pay | Admitting: Family Medicine

## 2015-05-13 ENCOUNTER — Other Ambulatory Visit: Payer: Self-pay | Admitting: Family Medicine

## 2015-05-20 ENCOUNTER — Other Ambulatory Visit: Payer: Self-pay | Admitting: Family Medicine

## 2015-07-23 ENCOUNTER — Other Ambulatory Visit: Payer: Self-pay | Admitting: Family Medicine

## 2015-08-21 ENCOUNTER — Other Ambulatory Visit: Payer: Self-pay | Admitting: Family Medicine

## 2015-09-03 ENCOUNTER — Ambulatory Visit: Payer: BLUE CROSS/BLUE SHIELD | Admitting: Family Medicine

## 2015-09-24 ENCOUNTER — Ambulatory Visit (INDEPENDENT_AMBULATORY_CARE_PROVIDER_SITE_OTHER): Payer: No Typology Code available for payment source | Admitting: Emergency Medicine

## 2015-09-24 VITALS — BP 155/98 | HR 60 | Temp 98.0°F | Resp 16 | Ht 64.0 in | Wt 139.0 lb

## 2015-09-24 DIAGNOSIS — I10 Essential (primary) hypertension: Secondary | ICD-10-CM

## 2015-09-24 DIAGNOSIS — E782 Mixed hyperlipidemia: Secondary | ICD-10-CM

## 2015-09-24 DIAGNOSIS — E039 Hypothyroidism, unspecified: Secondary | ICD-10-CM

## 2015-09-24 LAB — COMPREHENSIVE METABOLIC PANEL
ALT: 19 U/L (ref 9–46)
AST: 19 U/L (ref 10–35)
Albumin: 4.5 g/dL (ref 3.6–5.1)
Alkaline Phosphatase: 71 U/L (ref 40–115)
BILIRUBIN TOTAL: 0.7 mg/dL (ref 0.2–1.2)
BUN: 18 mg/dL (ref 7–25)
CHLORIDE: 101 mmol/L (ref 98–110)
CO2: 29 mmol/L (ref 20–31)
CREATININE: 1.01 mg/dL (ref 0.70–1.33)
Calcium: 9.5 mg/dL (ref 8.6–10.3)
GLUCOSE: 82 mg/dL (ref 65–99)
Potassium: 4.2 mmol/L (ref 3.5–5.3)
SODIUM: 137 mmol/L (ref 135–146)
Total Protein: 7.5 g/dL (ref 6.1–8.1)

## 2015-09-24 LAB — LIPID PANEL
Cholesterol: 221 mg/dL — ABNORMAL HIGH (ref 125–200)
HDL: 55 mg/dL (ref >=40)
LDL CALC: 153 mg/dL — AB (ref <130)
Total CHOL/HDL Ratio: 4 Ratio (ref <=5.0)
Triglycerides: 64 mg/dL (ref <150)
VLDL: 13 mg/dL (ref <30)

## 2015-09-24 LAB — TSH: TSH: 1.787 u[IU]/mL (ref 0.350–4.500)

## 2015-09-24 MED ORDER — LISINOPRIL 20 MG PO TABS: ORAL_TABLET | ORAL | Status: DC

## 2015-09-24 MED ORDER — SIMVASTATIN 20 MG PO TABS: 20.0000 mg | ORAL_TABLET | Freq: Every evening | ORAL | Status: DC

## 2015-09-24 MED ORDER — LEVOTHYROXINE SODIUM 100 MCG PO TABS: ORAL_TABLET | ORAL | Status: DC

## 2015-09-24 NOTE — Progress Notes (Signed)
Subjective:  Patient ID: James [name], male    DOB: 1961/08/26  Age: 54 y.o. MRN: [id number]  CC: Medication Refill   HPI CALE BETHARD presents   Patient has a history of hypertension and hypothyroidism and without his antihypertensives for 3 months at least and took his last thyroid replacement 3 days ago. He's come in to get refills on his medication. He is asymptomatic. Says that he feels little fatigability's office thyroid and has no other endorgan issues  History Gregor has a past medical history of Allergy; Substance abuse; Thyroid disease; and Hypertension.   He has past surgical history that includes Fracture surgery; Wisdom tooth extraction; and Thyroid surgery (2006).   His  family history includes Heart disease in his paternal grandfather; Hypertension in his father, maternal grandmother, and paternal grandmother; Mental illness in his mother.  He   reports that he has never smoked. He has never used smokeless tobacco. He reports that he does not drink alcohol or use illicit drugs.  Outpatient Prescriptions Prior to Visit  Medication Sig Dispense Refill  . cetirizine (ZYRTEC) 10 MG tablet Take 10 mg by mouth daily.    Marland Kitchen diltiazem (TIAZAC) 240 MG 24 hr capsule Take 1 capsule (240 mg total) by mouth daily. Please return to [facility] in October 2016 for refills. 30 capsule 6  . levothyroxine (SYNTHROID, LEVOTHROID) 100 MCG tablet TAKE ONE TABLET BY MOUTH ONCE DAILY BEFORE BREAKFAST 90 tablet 0  . lisinopril (PRINIVIL,ZESTRIL) 40 MG tablet TAKE 1 TABLET BY MOUTH DAILY 30 tablet 0  . simvastatin (ZOCOR) 20 MG tablet Take 1 tablet (20 mg total) by mouth every evening. PATIENT NEEDS OFFICE VISIT/FASTING LABS FOR ADDITIONAL REFILLS 30 tablet 1   No facility-administered medications prior to visit.    Social History   Social History  . Marital Status: Single    Spouse Name: N/A  . Number of Children: N/A  . Years of Education: N/A   Social History Main Topics  . Smoking  status: Never Smoker   . Smokeless tobacco: Never Used  . Alcohol Use: No  . Drug Use: No  . Sexual Activity: No   Other Topics Concern  . None   Social History Narrative     Review of Systems  Constitutional: Negative for fever, chills and appetite change.  HENT: Negative for congestion, ear pain, postnasal drip, sinus pressure and sore throat.   Eyes: Negative for pain and redness.  Respiratory: Negative for cough, shortness of breath and wheezing.   Cardiovascular: Negative for leg swelling.  Gastrointestinal: Negative for nausea, vomiting, abdominal pain, diarrhea, constipation and blood in stool.  Endocrine: Negative for polyuria.  Genitourinary: Negative for dysuria, urgency, frequency and flank pain.  Musculoskeletal: Negative for gait problem.  Skin: Negative for rash.  Neurological: Negative for weakness and headaches.  Psychiatric/Behavioral: Negative for confusion and decreased concentration. The patient is not nervous/anxious.     Objective:  BP 155/98 mmHg  Pulse 60  Temp(Src) 98 F (36.7 C) (Oral)  Resp 16  Ht  (1.626 m)  Wt 139 lb (63.05 kg)  BMI 23.85 kg/m2  SpO2 96%  Physical Exam  Constitutional: He is oriented to person, place, and time. He appears well-developed and well-nourished. No distress.  HENT:  Head: Normocephalic and atraumatic.  Right Ear: External ear normal.  Left Ear: External ear normal.  Nose: Nose normal.  Eyes: Conjunctivae and EOM are normal. Pupils are equal, round, and reactive to light. No scleral icterus.  Neck: Normal range of motion. Neck supple. No tracheal deviation present.  Cardiovascular: Normal rate, regular rhythm and normal heart sounds.   Pulmonary/Chest: Effort normal. No respiratory distress. He has no wheezes. He has no rales.  Abdominal: He exhibits no mass. There is no tenderness. There is no rebound and no guarding.  Musculoskeletal: He exhibits no edema.  Lymphadenopathy:    He has no cervical  adenopathy.  Neurological: He is alert and oriented to person, place, and time. Coordination normal.  Skin: Skin is warm and dry. No rash noted.  Psychiatric: He has a normal mood and affect. His behavior is normal.      Assessment & Plan:   [name] was seen today for medication refill.  Diagnoses and all orders for this visit:  Essential hypertension, benign -     Comprehensive metabolic panel -     Lipid panel -     TSH  Hypothyroidism, unspecified hypothyroidism type -     Comprehensive metabolic panel -     Lipid panel -     TSH  Mixed hyperlipidemia -     Comprehensive metabolic panel -     Lipid panel -     TSH  Other orders -     lisinopril (PRINIVIL,ZESTRIL) 20 MG tablet; TAKE 1 TABLET BY MOUTH DAILY -     levothyroxine (SYNTHROID, LEVOTHROID) 100 MCG tablet; TAKE ONE TABLET BY MOUTH ONCE DAILY BEFORE BREAKFAST -     simvastatin (ZOCOR) 20 MG tablet; Take 1 tablet (20 mg total) by mouth every evening.   I have discontinued Mr. Remonia Richterarley's diltiazem. I have also changed his lisinopril and simvastatin. Additionally, I am having him maintain his cetirizine and levothyroxine.  Meds ordered this encounter  Medications  . lisinopril (PRINIVIL,ZESTRIL) 20 MG tablet    Sig: TAKE 1 TABLET BY MOUTH DAILY    Dispense:  30 tablet    Refill:  2    CYCLE FILL MEDICATION. Authorization is required for next refill.  . levothyroxine (SYNTHROID, LEVOTHROID) 100 MCG tablet    Sig: TAKE ONE TABLET BY MOUTH ONCE DAILY BEFORE BREAKFAST    Dispense:  90 tablet    Refill:  0  . simvastatin (ZOCOR) 20 MG tablet    Sig: Take 1 tablet (20 mg total) by mouth every evening.    Dispense:  30 tablet    Refill:  1    Appropriate red flag conditions were discussed with the patient as well as actions that should be taken.  Patient expressed his understanding.  Follow-up: No Follow-up on file.  Carmelina DaneAnderson, Jeffery S, MD

## 2015-12-07 ENCOUNTER — Other Ambulatory Visit: Payer: Self-pay

## 2015-12-07 MED ORDER — SIMVASTATIN 20 MG PO TABS: 20.0000 mg | ORAL_TABLET | Freq: Every evening | ORAL | Status: DC

## 2015-12-27 ENCOUNTER — Other Ambulatory Visit: Payer: Self-pay

## 2015-12-27 MED ORDER — LEVOTHYROXINE SODIUM 100 MCG PO TABS
ORAL_TABLET | ORAL | Status: DC
Start: 2015-12-27 — End: 2016-04-10

## 2016-02-29 ENCOUNTER — Other Ambulatory Visit: Payer: Self-pay

## 2016-02-29 MED ORDER — LISINOPRIL 20 MG PO TABS
ORAL_TABLET | ORAL | Status: DC
Start: 2016-02-29 — End: 2016-05-06

## 2016-03-14 ENCOUNTER — Other Ambulatory Visit: Payer: Self-pay | Admitting: Physician Assistant

## 2016-03-14 ENCOUNTER — Telehealth: Payer: Self-pay

## 2016-03-14 NOTE — Telephone Encounter (Signed)
Msg is for James [name], [facility] called about Devoria Albeartia XT. Pt hasn't been taking it because of it's cost. Pt has been on and off with this medication and pharmacy wants to know if there is an alternative for this. They have sent in a new rxn request.  Please advise  [phone]

## 2016-03-16 NOTE — Telephone Encounter (Signed)
I have no way of knowing what would be cheaper on pt's insurance formulary. We normally would tell the pt to check with the pharmacy or call the insurance.  Will be happy to address that with the pt at his next visit unless they have a recommendation

## 2016-03-16 NOTE — Telephone Encounter (Signed)
Please call this patient. When he was here in 09/2015, Dr. Dareen PianoAnderson stopped this medication. Please get the details of what he IS taking presently.  In addition, he needs to RTC for re-evaluation of his HTN, as his BP was elevated at the last visit (which is why the meds were changed at the visit).

## 2016-03-16 NOTE — Telephone Encounter (Signed)
Pt last seen for HTN 09/24/15, so due for f/up this month. Dr Clelia CroftShaw, do you want to send in a cheaper alternative and then have pt come in for f/up after he has been taking it, or want to see him first before Rxing anything new?

## 2016-03-17 NOTE — Telephone Encounter (Signed)
Spoke to pharm who reported that pt does not have Rx ins and that cash price on any calcium channel blockers will be higher priced even if generic. If you wanted to put him on a med from another class, amlodipine is pretty inexpensive. Please advise if you want to change, or just have pt wait until he RTC for f/up?

## 2016-03-17 NOTE — Telephone Encounter (Signed)
It looks like at his last visit here in Dec, he had been off of all of his medicines for several months and his BP was only mildly elevated so he was recommended to just take lisinopril 20 for BP (had been 40 prior) and his diltiazem was stopped.    Also, if pt doesn't have insurance, please make sure he is aware of how to access the orange card program and or [place] and wellness to hopefully receive more affordable care.  The orange card program is sponsored by the [facility]. Call 514-250-59245613341485 to see if this might be something for which you are eligible. This can ensure you have access to the medical and dental services that [facility] help provide to people without health insurance.  Here is a clinic set up by [facility] to care for people without health insurance so may be able to get you care at much cheaper cost with a large discount on labs, imaging, and medication. [facility] [address] [place], [place] [address] Hours of Operation Mon - Fri: 9 a.m. - 6 p.m. Main: [phone]

## 2016-03-20 NOTE — Telephone Encounter (Signed)
This is a duplicate. See notes under other RF req.

## 2016-03-20 NOTE — Telephone Encounter (Signed)
I LMOM for pt to CB and give him the info from Dr Clelia CroftShaw below and also find out what BP med pt has been taking, as he was supposed to just be taking lisinopril 20 QD. Also called pharm and gave them this info and 1 mos RF of lisinopril to give pt time to come in or get to one of these other clinics.

## 2016-03-22 NOTE — Telephone Encounter (Signed)
LMOM again for pt to CB. 

## 2016-03-28 NOTE — Telephone Encounter (Signed)
I have not been able to reach pt by phone and he has not returned my call. I will send him info from Dr Clelia [facility]ftShaw by [facility] message.

## 2016-04-10 ENCOUNTER — Other Ambulatory Visit: Payer: Self-pay | Admitting: Physician Assistant

## 2016-04-16 ENCOUNTER — Other Ambulatory Visit: Payer: Self-pay | Admitting: Physician Assistant

## 2016-04-21 ENCOUNTER — Ambulatory Visit: Payer: No Typology Code available for payment source

## 2016-05-03 ENCOUNTER — Other Ambulatory Visit: Payer: Self-pay | Admitting: Family Medicine

## 2016-05-06 ENCOUNTER — Other Ambulatory Visit: Payer: Self-pay | Admitting: Family Medicine

## 2016-05-08 ENCOUNTER — Other Ambulatory Visit: Payer: Self-pay | Admitting: Family Medicine

## 2016-05-19 ENCOUNTER — Other Ambulatory Visit: Payer: Self-pay | Admitting: Family Medicine

## 2016-05-22 ENCOUNTER — Other Ambulatory Visit: Payer: Self-pay | Admitting: Family Medicine

## 2016-05-23 ENCOUNTER — Telehealth: Payer: Self-pay

## 2016-05-23 NOTE — Telephone Encounter (Signed)
Pharm faxed req to RF levothyroxine. We put message on last RF that pt needs to RTC to get more refills. Tried to call pt and could not reach him on cell and no VM. H # is a HT pharmacy number. Sent back note on fax to pharm asking for pt to contact us or RTC for labs/ f/up appt.

## 2016-06-16 ENCOUNTER — Other Ambulatory Visit: Payer: Self-pay | Admitting: Family Medicine

## 2016-06-22 ENCOUNTER — Other Ambulatory Visit: Payer: Self-pay | Admitting: Family Medicine

## 2016-07-03 ENCOUNTER — Other Ambulatory Visit: Payer: Self-pay | Admitting: Family Medicine

## 2017-09-14 ENCOUNTER — Other Ambulatory Visit: Payer: Self-pay

## 2017-09-14 ENCOUNTER — Encounter (HOSPITAL_COMMUNITY): Payer: Self-pay

## 2017-09-14 ENCOUNTER — Emergency Department (HOSPITAL_COMMUNITY)
Admission: EM | Admit: 2017-09-14 | Discharge: 2017-09-14 | Disposition: A | Discharge status: Home / Self Care | Payer: Self-pay | Attending: Emergency Medicine | Admitting: Emergency Medicine

## 2017-09-14 DIAGNOSIS — Z79899 Other long term (current) drug therapy: Secondary | ICD-10-CM | POA: Insufficient documentation

## 2017-09-14 DIAGNOSIS — Z760 Encounter for issue of repeat prescription: Secondary | ICD-10-CM

## 2017-09-14 DIAGNOSIS — E039 Hypothyroidism, unspecified: Secondary | ICD-10-CM

## 2017-09-14 DIAGNOSIS — I10 Essential (primary) hypertension: Secondary | ICD-10-CM

## 2017-09-14 LAB — TSH: TSH: 46.576 u[IU]/mL — ABNORMAL HIGH (ref 0.350–4.500)

## 2017-09-14 LAB — I-STAT CHEM 8, ED
BUN: 16 mg/dL (ref 6–20)
CALCIUM ION: 1.14 mmol/L — AB (ref 1.15–1.40)
CHLORIDE: 103 mmol/L (ref 101–111)
CREATININE: 1 mg/dL (ref 0.61–1.24)
GLUCOSE: 105 mg/dL — AB (ref 65–99)
HCT: 47 % (ref 39.0–52.0)
Hemoglobin: 16 g/dL (ref 13.0–17.0)
Potassium: 3.6 mmol/L (ref 3.5–5.1)
Sodium: 144 mmol/L (ref 135–145)
TCO2: 26 mmol/L (ref 22–32)

## 2017-09-14 MED ORDER — AMLODIPINE BESYLATE 5 MG PO TABS
5.0000 mg | ORAL_TABLET | Freq: Once | ORAL | Status: AC
  Administered 2017-09-14: 5 mg via ORAL
  Filled 2017-09-14: qty 1

## 2017-09-14 MED ORDER — LEVOTHYROXINE SODIUM 100 MCG PO TABS
100.0000 ug | ORAL_TABLET | Freq: Once | ORAL | Status: AC
  Administered 2017-09-14: 100 ug via ORAL
  Filled 2017-09-14: qty 1

## 2017-09-14 MED ORDER — LISINOPRIL 10 MG PO TABS
10.0000 mg | ORAL_TABLET | Freq: Every day | ORAL | 0 refills | Status: DC
Start: 2017-09-14 — End: 2017-10-26

## 2017-09-14 MED ORDER — AMLODIPINE BESYLATE 5 MG PO TABS: 5.0000 mg | ORAL_TABLET | Freq: Every day | ORAL | 0 refills | Status: DC

## 2017-09-14 MED ORDER — LISINOPRIL 10 MG PO TABS
10.0000 mg | ORAL_TABLET | Freq: Once | ORAL | Status: AC
  Administered 2017-09-14: 10 mg via ORAL
  Filled 2017-09-14: qty 1

## 2017-09-14 MED ORDER — LEVOTHYROXINE SODIUM 100 MCG PO TABS: 100.0000 ug | ORAL_TABLET | Freq: Every day | ORAL | 0 refills | Status: DC

## 2017-09-14 MED ORDER — LEVOTHYROXINE SODIUM 125 MCG PO TABS
125.0000 ug | ORAL_TABLET | Freq: Once | ORAL | Status: DC
  Filled 2017-09-14: qty 1

## 2017-09-14 MED ORDER — LISINOPRIL 10 MG PO TABS: 10.0000 mg | ORAL_TABLET | Freq: Every day | ORAL | 0 refills | Status: DC

## 2017-09-14 NOTE — ED Provider Notes (Signed)
[facility] Provider Note   CSN: [id number] Arrival date & time: 09/14/17  1319     History   Chief Complaint Chief Complaint  Patient presents with  . Medication Refill    HPI Percell BostonWayne M Laramee is a 56 y.o. male.  HPI  [name] is a 56 y/o male with a h/o HTN, hyperlipidemia, and hypothyroid s/p partial thyroidectomy who presents to the ED requesting a medication refill. Pt normally takes Lisinopril 20 mg QD, Amlodipine 5 mg QD, and 125 mcg Levothyroxine QD. He has not taken his HTN medications for about 30 days and has not taken his levothyroxine for about 2 weeks. Pt reports that he is asymptomatic at this time and specifically denies any current symptoms of chest pain, palpitations, SOB, headaches, blurred vision or double vision.    Past Medical History:  Diagnosis Date  . Allergy   . Hypertension   . Substance abuse (HCC)   . Thyroid disease     Patient Active Problem List   Diagnosis Date Noted  . Mixed hyperlipidemia 09/24/2015  . Hypothyroid 12/23/2012  . Essential hypertension, benign 12/23/2012  . Other and unspecified hyperlipidemia 12/23/2012  . Encounter for long-term (current) use of other medications 12/23/2012    Past Surgical History:  Procedure Laterality Date  . FRACTURE SURGERY    . THYROID SURGERY  2006  . WISDOM TOOTH EXTRACTION         Home Medications    Prior to Admission medications   Medication Sig Start Date End Date Taking? Authorizing Provider  amLODipine (NORVASC) 5 MG tablet Take 1 tablet (5 mg total) by mouth daily. 09/14/17   Renne CriglerGeiple, Joshua, PA-C  cetirizine (ZYRTEC) 10 MG tablet Take 10 mg by mouth daily.    [provider]  levothyroxine (SYNTHROID, LEVOTHROID) 100 MCG tablet Take 1 tablet (100 mcg total) by mouth daily. 09/14/17   Renne CriglerGeiple, Joshua, PA-C  lisinopril (PRINIVIL,ZESTRIL) 10 MG tablet Take 1 tablet (10 mg total) by mouth daily. 09/14/17   Renne CriglerGeiple, Joshua, PA-C    simvastatin (ZOCOR) 20 MG tablet Take 1 tablet (20 mg total) by mouth every evening. 12/07/15   Sherren MochaShaw, Eva N, MD    Family History Family History  Problem Relation Age of Onset  . Mental illness Mother   . Hypertension Father   . Hypertension Maternal Grandmother   . Hypertension Paternal Grandmother   . Heart disease Paternal Grandfather     Social History Social History   Tobacco Use  . Smoking status: Never Smoker  . Smokeless tobacco: Never Used  Substance Use Topics  . Alcohol use: No    Alcohol/week: 0.0 oz  . Drug use: No     Allergies   Patient has no known allergies.   Review of Systems Review of Systems  Constitutional: Negative for chills and fever.  Eyes: Negative for blurred vision and double vision.  Respiratory: Negative for shortness of breath.   Cardiovascular: Negative for chest pain, palpitations and leg swelling.  Neurological: Negative for dizziness and headaches.        Physical Exam Updated Vital Signs BP (!) 180/106 (BP Location: Left Arm)   Pulse 60   Temp 98.3 F (36.8 C) (Oral)   Resp 14   Ht 5\' 5"  (1.651 m)   Wt 64.9 kg (143 lb)   SpO2 100%   BMI 23.80 kg/m   Physical Exam  Constitutional: He appears well-developed. No distress.  HENT:  Head: Normocephalic and atraumatic.  Eyes: Conjunctivae and EOM are normal. Pupils are equal, round, and reactive to light.  Cardiovascular: Normal rate, regular rhythm and normal heart sounds.  No murmur heard. Pulmonary/Chest: Effort normal and breath sounds normal. No stridor. No respiratory distress. He has no wheezes.  Neurological: He is alert. No cranial nerve deficit. He exhibits normal muscle tone.  Skin: Skin is warm and dry. He is not diaphoretic.     ED Treatments / Results  Labs (all labs ordered are listed, but only abnormal results are displayed) Labs Reviewed  I-STAT CHEM 8, ED - Abnormal; Notable for the following components:      Result Value   Glucose, Bld 105 (*)     Calcium, Ion 1.14 (*)    All other components within normal limits  TSH    EKG  EKG Interpretation None       Radiology No results found.  Procedures Procedures (including critical care time)  Medications Ordered in ED Medications  lisinopril (PRINIVIL,ZESTRIL) tablet 10 mg (10 mg Oral Given 09/14/17 1609)  amLODipine (NORVASC) tablet 5 mg (5 mg Oral Given 09/14/17 1609)  levothyroxine (SYNTHROID, LEVOTHROID) tablet 100 mcg (100 mcg Oral Given 09/14/17 1609)     Initial Impression / Assessment and Plan / ED Course  I have reviewed the triage vital signs and the nursing notes.  Pertinent labs & imaging results that were available during my care of the patient were reviewed by me and considered in my medical decision making (see chart for details).  1545: Pt seen and evaluated. Medications ordered.  1630: Rechecked patient. Pt continues to deny any symptoms at this time.   1656: Rechecked patient. Pt has received medications in the ED and repeat BP is 180/106. He continues to deny any chest pain or SOB. Reviewed patients labs. Discussed the plan for discharge and follow up with [facility] within the next week. Rx given for lisinopril 10 mg, amlodipine 5 mg, and levothyroxine 100 mcg. Advised the patient to return to the ED if he experiences any chest pain or SOB. All questions were addressed with the patient.   Final Clinical Impressions(s) / ED Diagnoses   Final diagnoses:  Hypertension, unspecified type  Medication refill  Hypothyroidism, unspecified type   Pt presented to the ED requesting a medication refill for his HTN and hypothyroid medications. His BP was elevated on initial presentation, but patient was asymptomatic and remained so throughout his stay. Physical exam showed no signs of end organ damage. Creatinine was WNL and TSH is still pending. Pt does not appear to be at immediate risk for hypertensive emergency and is safe to be  discharged with outpatient follow up at [facility].  ED Discharge Orders        Ordered    lisinopril (PRINIVIL,ZESTRIL) 10 MG tablet  Daily,   Status:  Discontinued     09/14/17 1639    amLODipine (NORVASC) 5 MG tablet  Daily,   Status:  Discontinued     09/14/17 1639    levothyroxine (SYNTHROID, LEVOTHROID) 100 MCG tablet  Daily,   Status:  Discontinued     09/14/17 1639    amLODipine (NORVASC) 5 MG tablet  Daily     09/14/17 1646    levothyroxine (SYNTHROID, LEVOTHROID) 100 MCG tablet  Daily     09/14/17 1646    lisinopril (PRINIVIL,ZESTRIL) 10 MG tablet  Daily     09/14/17 1646       Markee Remlinger,  Iaan Oregel S, PA-C 09/14/17 1657    Karrie MeresCouture, Johanthan Kneeland S, PA-C 09/14/17 1731    Bethann BerkshireZammit, Joseph, MD 09/14/17 2328

## 2017-09-14 NOTE — Discharge Instructions (Signed)
Please return to the emergency department if you experience any chest pain or shortness of breath.

## 2017-09-14 NOTE — ED Triage Notes (Signed)
Per Pt, Pt is coming to have medications refilled because he has been out of BP Med and Thyroid medication for two weeks. Reports he hasn't seen a doctor in one year.

## 2017-09-14 NOTE — ED Provider Notes (Signed)
4:45 PM Patient seen in conjunction with Couture PA-C.   Patient here for medication refill.  History of hypertension and hypothyroidism, not on his medications for about 1 year.  Elevated blood pressure noted here.  He does not report any symptoms suggestive of endorgan damage.  Normal kidney function today.  Will restart on amlodipine, lisinopril, levothyroxine.  Patient to [facility].  BP (!) 180/106 (BP Location: Left Arm)   Pulse 60   Temp 98.3 F (36.8 C) (Oral)   Resp 14   Ht 5\' 5"  (1.651 m)   Wt 64.9 kg (143 lb)   SpO2 100%   BMI 23.80 kg/m       James [name], James Oddo, PA-C 09/14/17 1704    Bethann BerkshireZammit, Joseph, MD 09/14/17 2328

## 2017-10-26 ENCOUNTER — Ambulatory Visit (HOSPITAL_COMMUNITY)
Admission: EM | Admit: 2017-10-26 | Discharge: 2017-10-26 | Disposition: A | Discharge status: Home / Self Care | Payer: Self-pay | Attending: Emergency Medicine | Admitting: Emergency Medicine

## 2017-10-26 ENCOUNTER — Encounter (HOSPITAL_COMMUNITY): Payer: Self-pay | Admitting: Emergency Medicine

## 2017-10-26 ENCOUNTER — Other Ambulatory Visit: Payer: Self-pay

## 2017-10-26 DIAGNOSIS — Z760 Encounter for issue of repeat prescription: Secondary | ICD-10-CM

## 2017-10-26 DIAGNOSIS — E039 Hypothyroidism, unspecified: Secondary | ICD-10-CM

## 2017-10-26 DIAGNOSIS — I10 Essential (primary) hypertension: Secondary | ICD-10-CM

## 2017-10-26 MED ORDER — AMLODIPINE BESYLATE 5 MG PO TABS: 5.0000 mg | ORAL_TABLET | Freq: Every day | ORAL | 0 refills | Status: DC

## 2017-10-26 MED ORDER — LEVOTHYROXINE SODIUM 100 MCG PO TABS: 100.0000 ug | ORAL_TABLET | Freq: Every day | ORAL | 0 refills | Status: DC

## 2017-10-26 MED ORDER — LISINOPRIL 10 MG PO TABS: 10.0000 mg | ORAL_TABLET | Freq: Every day | ORAL | 0 refills | Status: DC

## 2017-10-26 NOTE — ED Provider Notes (Signed)
[facility]    CSN: [id number] Arrival date & time: 10/26/17  1504     History   Chief Complaint Chief Complaint  Patient presents with  . Medication Refill  . Hypertension    HPI James [name] is a 57 y.o. male.   James [name] presents with requests for medication refill. He was prescribed amlodipine, lisinopril and synthroid 12/7 from the ER and has not yet established with a primary care provider. He states he had some left over lisinopril he was taking up until yesterday when he ran out. Denies headache, dizziness, shortness of breath, edema, chest pain. Labs collected on 12/7, noted. History of hypertension, substance abuse, hypothyroid. Ambulatory.    ROS per HPI.       Past Medical History:  Diagnosis Date  . Allergy   . Hypertension   . Substance abuse (HCC)   . Thyroid disease     Patient Active Problem List   Diagnosis Date Noted  . Mixed hyperlipidemia 09/24/2015  . Hypothyroid 12/23/2012  . Essential hypertension, benign 12/23/2012  . Other and unspecified hyperlipidemia 12/23/2012  . Encounter for long-term (current) use of other medications 12/23/2012    Past Surgical History:  Procedure Laterality Date  . FRACTURE SURGERY    . THYROID SURGERY  2006  . WISDOM TOOTH EXTRACTION         Home Medications    Prior to Admission medications   Medication Sig Start Date End Date Taking? Authorizing Provider  amLODipine (NORVASC) 5 MG tablet Take 1 tablet (5 mg total) by mouth daily. 10/26/17   Georgetta Haber, NP  cetirizine (ZYRTEC) 10 MG tablet Take 10 mg by mouth daily.    [provider]  levothyroxine (SYNTHROID, LEVOTHROID) 100 MCG tablet Take 1 tablet (100 mcg total) by mouth daily. 10/26/17   Georgetta Haber, NP  lisinopril (PRINIVIL,ZESTRIL) 10 MG tablet Take 1 tablet (10 mg total) by mouth daily. 10/26/17   Georgetta Haber, NP  simvastatin (ZOCOR) 20 MG tablet Take 1 tablet (20 mg total) by mouth every evening. 12/07/15    Sherren Mocha, MD    Family History Family History  Problem Relation Age of Onset  . Mental illness Mother   . Hypertension Father   . Hypertension Maternal Grandmother   . Hypertension Paternal Grandmother   . Heart disease Paternal Grandfather     Social History Social History   Tobacco Use  . Smoking status: Never Smoker  . Smokeless tobacco: Never Used  Substance Use Topics  . Alcohol use: No    Alcohol/week: 0.0 oz  . Drug use: No     Allergies   Patient has no known allergies.   Review of Systems Review of Systems   Physical Exam Triage Vital Signs ED Triage Vitals [10/26/17 1518]  Enc Vitals Group     BP (!) 208/96     Pulse Rate 69     Resp 18     Temp (!) 97.4 F (36.3 C)     Temp src      SpO2 100 %     Weight      Height      Head Circumference      Peak Flow      Pain Score      Pain Loc      Pain Edu?      Excl. in GC?    No data found.  Updated Vital Signs BP (!) 208/96   Pulse  69   Temp (!) 97.4 F (36.3 C)   Resp 18   SpO2 100%   Visual Acuity Right Eye Distance:   Left Eye Distance:   Bilateral Distance:    Right Eye Near:   Left Eye Near:    Bilateral Near:     Physical Exam  Constitutional: He is oriented to person, place, and time. He appears well-developed and well-nourished.  Cardiovascular: Normal rate and regular rhythm.  Pulmonary/Chest: Effort normal and breath sounds normal.  Neurological: He is alert and oriented to person, place, and time.  Skin: Skin is warm and dry.     UC Treatments / Results  Labs (all labs ordered are listed, but only abnormal results are displayed) Labs Reviewed - No data to display  EKG  EKG Interpretation None       Radiology No results found.  Procedures Procedures (including critical care time)  Medications Ordered in UC Medications - No data to display   Initial Impression / Assessment and Plan / UC Course  I have reviewed the triage vital signs and the  nursing notes.  Pertinent labs & imaging results that were available during my care of the patient were reviewed by me and considered in my medical decision making (see chart for details).     Hypertensive today in clinic, has not taken any of his medications. Asymptomatic. Normal kidney function 12/7, tsh 46. bp medications and synthroid refilled today. Encouraged follow up with PCP for recheck and follow up. Return precautions provided. Patient verbalized understanding and agreeable to plan.  Ambulatory out of clinic without difficulty.    Final Clinical Impressions(s) / UC Diagnoses   Final diagnoses:  Encounter for medication refill  Hypertension, unspecified type    ED Discharge Orders        Ordered    amLODipine (NORVASC) 5 MG tablet  Daily     10/26/17 1632    levothyroxine (SYNTHROID, LEVOTHROID) 100 MCG tablet  Daily     10/26/17 1632    lisinopril (PRINIVIL,ZESTRIL) 10 MG tablet  Daily     10/26/17 1632       Controlled Substance Prescriptions [place] Controlled Substance Registry consulted? Not Applicable   Georgetta HaberBurky, Alaja Goldinger B, NP 10/26/17 [phone]

## 2017-10-26 NOTE — Discharge Instructions (Signed)
Please take your blood pressure medications today. Please establish with a primary care provider for long term monitoring and management of your medications.  If you develop headache, dizziness, chest pain, palpitations or shortness of breath please go to ER

## 2017-10-26 NOTE — ED Triage Notes (Signed)
Pt denies any symptoms  

## 2017-10-26 NOTE — ED Triage Notes (Signed)
Pt states he was dx with hypertension and hypothyroid dec 7th and hes out of all his medicines. Denies symptoms. Pt hasn't taken his meds for two weeks.

## 2017-11-06 ENCOUNTER — Encounter: Payer: Self-pay | Admitting: Family Medicine

## 2017-11-06 ENCOUNTER — Ambulatory Visit: Payer: Self-pay | Attending: Family Medicine | Admitting: Family Medicine

## 2017-11-06 VITALS — BP 162/94 | HR 58 | Temp 97.5°F | Resp 16 | Ht 65.0 in | Wt 145.4 lb

## 2017-11-06 DIAGNOSIS — E039 Hypothyroidism, unspecified: Secondary | ICD-10-CM

## 2017-11-06 DIAGNOSIS — Z79890 Hormone replacement therapy (postmenopausal): Secondary | ICD-10-CM | POA: Insufficient documentation

## 2017-11-06 DIAGNOSIS — I10 Essential (primary) hypertension: Secondary | ICD-10-CM

## 2017-11-06 DIAGNOSIS — E785 Hyperlipidemia, unspecified: Secondary | ICD-10-CM

## 2017-11-06 DIAGNOSIS — Z79899 Other long term (current) drug therapy: Secondary | ICD-10-CM | POA: Insufficient documentation

## 2017-11-06 DIAGNOSIS — Z0000 Encounter for general adult medical examination without abnormal findings: Secondary | ICD-10-CM

## 2017-11-06 NOTE — Progress Notes (Signed)
Patient is here for hospital follow-up for hypertension. Patient request medication refills.

## 2017-11-06 NOTE — Patient Instructions (Signed)
Managing Your Hypertension Hypertension is commonly called high blood pressure. This is when the force of your blood pressing against the walls of your arteries is too strong. Arteries are blood vessels that carry blood from your heart throughout your body. Hypertension forces the heart to work harder to pump blood, and may cause the arteries to become narrow or stiff. Having untreated or uncontrolled hypertension can cause heart attack, stroke, kidney disease, and other problems. What are blood pressure readings? A blood pressure reading consists of a higher number over a lower number. Ideally, your blood pressure should be below 120/80. The first ("top") number is called the systolic pressure. It is a measure of the pressure in your arteries as your heart beats. The second ("bottom") number is called the diastolic pressure. It is a measure of the pressure in your arteries as the heart relaxes. What does my blood pressure reading mean? Blood pressure is classified into four stages. Based on your blood pressure reading, your health care provider may use the following stages to determine what type of treatment you need, if any. Systolic pressure and diastolic pressure are measured in a unit called mm Hg. Normal  Systolic pressure: below 120.  Diastolic pressure: below 80. Elevated  Systolic pressure: 120-129.  Diastolic pressure: below 80. Hypertension stage 1  Systolic pressure: 130-139.  Diastolic pressure: 80-89. Hypertension stage 2  Systolic pressure: 140 or above.  Diastolic pressure: 90 or above. What health risks are associated with hypertension? Managing your hypertension is an important responsibility. Uncontrolled hypertension can lead to:  A heart attack.  A stroke.  A weakened blood vessel (aneurysm).  Heart failure.  Kidney damage.  Eye damage.  Metabolic syndrome.  Memory and concentration problems.  What changes can I make to manage my  hypertension? Hypertension can be managed by making lifestyle changes and possibly by taking medicines. Your health care provider will help you make a plan to bring your blood pressure within a normal range. Eating and drinking  Eat a diet that is high in fiber and potassium, and low in salt (sodium), added sugar, and fat. An example eating plan is called the DASH (Dietary Approaches to Stop Hypertension) diet. To eat this way: ? Eat plenty of fresh fruits and vegetables. Try to fill half of your plate at each meal with fruits and vegetables. ? Eat whole grains, such as whole wheat pasta, brown rice, or whole grain bread. Fill about one quarter of your plate with whole grains. ? Eat low-fat diary products. ? Avoid fatty cuts of meat, processed or cured meats, and poultry with skin. Fill about one quarter of your plate with lean proteins such as fish, chicken without skin, beans, eggs, and tofu. ? Avoid premade and processed foods. These tend to be higher in sodium, added sugar, and fat.  Reduce your daily sodium intake. Most people with hypertension should eat less than 1,500 mg of sodium a day.  Limit alcohol intake to no more than 1 drink a day for nonpregnant women and 2 drinks a day for men. One drink equals 12 oz of beer, 5 oz of wine, or 1 oz of hard liquor. Lifestyle  Work with your health care provider to maintain a healthy body weight, or to lose weight. Ask what an ideal weight is for you.  Get at least 30 minutes of exercise that causes your heart to beat faster (aerobic exercise) most days of the week. Activities may include walking, swimming, or biking.  Include exercise   to strengthen your muscles (resistance exercise), such as weight lifting, as part of your weekly exercise routine. Try to do these types of exercises for 30 minutes at least 3 days a week.  Do not use any products that contain nicotine or tobacco, such as cigarettes and e-cigarettes. If you need help quitting, ask  your health care provider.  Control any long-term (chronic) conditions you have, such as high cholesterol or diabetes. Monitoring  Monitor your blood pressure at home as told by your health care provider. Your personal target blood pressure may vary depending on your medical conditions, your age, and other factors.  Have your blood pressure checked regularly, as often as told by your health care provider. Working with your health care provider  Review all the medicines you take with your health care provider because there may be side effects or interactions.  Talk with your health care provider about your diet, exercise habits, and other lifestyle factors that may be contributing to hypertension.  Visit your health care provider regularly. Your health care provider can help you create and adjust your plan for managing hypertension. Will I need medicine to control my blood pressure? Your health care provider may prescribe medicine if lifestyle changes are not enough to get your blood pressure under control, and if:  Your systolic blood pressure is 130 or higher.  Your diastolic blood pressure is 80 or higher.  Take medicines only as told by your health care provider. Follow the directions carefully. Blood pressure medicines must be taken as prescribed. The medicine does not work as well when you skip doses. Skipping doses also puts you at risk for problems. Contact a health care provider if:  You think you are having a reaction to medicines you have taken.  You have repeated (recurrent) headaches.  You feel dizzy.  You have swelling in your ankles.  You have trouble with your vision. Get help right away if:  You develop a severe headache or confusion.  You have unusual weakness or numbness, or you feel faint.  You have severe pain in your chest or abdomen.  You vomit repeatedly.  You have trouble breathing. Summary  Hypertension is when the force of blood pumping through  your arteries is too strong. If this condition is not controlled, it may put you at risk for serious complications.  Your personal target blood pressure may vary depending on your medical conditions, your age, and other factors. For most people, a normal blood pressure is less than 120/80.  Hypertension is managed by lifestyle changes, medicines, or both. Lifestyle changes include weight loss, eating a healthy, low-sodium diet, exercising more, and limiting alcohol. This information is not intended to replace advice given to you by your health care provider. Make sure you discuss any questions you have with your health care provider. Document Released: 06/19/2012 Document Revised: 08/23/2016 Document Reviewed: 08/23/2016 Elsevier Interactive Patient Education  2018 [employer]vier Inc[employer]  

## 2017-11-06 NOTE — Progress Notes (Signed)
Subjective:  Patient ID: James [name], male    DOB: 01/03/1961  Age: 57 y.o. MRN: [id number]  CC: Establish Care   HPI  James BostonWayne M Davia presents to establish care for hypertension and hypothyroidism.    Hypertension  Disease Monitoring   Blood pressure range: He does not check BP at home. He is not a current smoker. He reports not taking BP medication prior to visit.  Chest pain: no   Dyspnea: no   Claudication: no   Medication compliance: yes  Medication Side Effects  Lightheadedness: no   Urinary frequency: no   Edema: no   Impotence: no   Preventitive Healthcare:  Exercise: no   Diet Pattern: regular  Salt Restriction: no   Hypothyroidism Onset 2003. Symptoms consist of denies fatigue, weight changes, heat/cold intolerance, bowel/skin changes or CVS symptoms.  Previous thyroid studies include TSH. He reports history of right sided thyroidectomy. .    Outpatient Medications Prior to Visit  Medication Sig Dispense Refill  . amLODipine (NORVASC) 5 MG tablet Take 1 tablet (5 mg total) by mouth daily. 30 tablet 0  . levothyroxine (SYNTHROID, LEVOTHROID) 100 MCG tablet Take 1 tablet (100 mcg total) by mouth daily. 30 tablet 0  . lisinopril (PRINIVIL,ZESTRIL) 10 MG tablet Take 1 tablet (10 mg total) by mouth daily. 30 tablet 0  . cetirizine (ZYRTEC) 10 MG tablet Take 10 mg by mouth daily.    . simvastatin (ZOCOR) 20 MG tablet Take 1 tablet (20 mg total) by mouth every evening. (Patient not taking: Reported on 11/06/2017) 90 tablet 1   No facility-administered medications prior to visit.     ROS Review of Systems  Constitutional: Negative.   Eyes: Negative.   Respiratory: Negative.   Cardiovascular: Negative.   Gastrointestinal: Negative.   Endocrine: Negative.   Skin: Negative.    Objective:  BP (!) 162/94 (BP Location: Left Arm, Patient Position: Sitting, Cuff Size: Normal)   Pulse (!) 58   Temp (!) 97.5 F (36.4 C) (Oral)   Resp 16   Ht 5\' 5"  (1.651 m)    Wt 145 lb 6.4 oz (66 kg)   SpO2 100%   BMI 24.20 kg/m   BP/Weight 11/06/2017 10/26/2017 09/14/2017  Systolic BP 162 208 180  Diastolic BP 94 96 106  Wt. (Lbs) 145.4 - 143  BMI 24.2 - 23.8    Physical Exam  Constitutional: He appears well-developed and well-nourished.  Eyes: Conjunctivae are normal. Pupils are equal, round, and reactive to light.  Neck: No JVD present.  Cardiovascular: Normal rate, regular rhythm, normal heart sounds and intact distal pulses.  Pulmonary/Chest: Effort normal and breath sounds normal.  Abdominal: Soft. Bowel sounds are normal. There is no tenderness.  Skin: Skin is warm and dry.  Psychiatric: He has a normal mood and affect.  Nursing note and vitals reviewed.   Assessment & Plan:   1. Essential hypertension, benign Schedule BP recheck in 1 week with nurse. If BP is greater than 90/60 (MAP 65 or greater) but not less than 130/80 may increase dose of lisinopril to 20 mgQD and recheck in another 2 weeks.  - CMP and Liver  2. Hypothyroidism, unspecified type  - Thyroid Panel With TSH  3. Dyslipidemia  - Lipid Panel  4. Healthcare maintenance  - Hepatitis C Antibody - HIV antibody (with reflex)       Follow-up: Return in about 1 week (around 11/13/2017) for BP check with Travia.   Lizbeth BarkMandesia R Avionna Bower FNP

## 2017-11-07 LAB — LIPID PANEL
CHOL/HDL RATIO: 3.8 ratio (ref 0.0–5.0)
Cholesterol, Total: 235 mg/dL — ABNORMAL HIGH (ref 100–199)
HDL: 62 mg/dL (ref >39)
LDL Calculated: 162 mg/dL — ABNORMAL HIGH (ref 0–99)
TRIGLYCERIDES: 56 mg/dL (ref 0–149)
VLDL CHOLESTEROL CAL: 11 mg/dL (ref 5–40)

## 2017-11-07 LAB — CMP AND LIVER
ALT: 16 IU/L (ref 0–44)
AST: 21 IU/L (ref 0–40)
Albumin: 4.8 g/dL (ref 3.5–5.5)
Alkaline Phosphatase: 88 IU/L (ref 39–117)
BUN: 17 mg/dL (ref 6–24)
Bilirubin Total: 0.4 mg/dL (ref 0.0–1.2)
Bilirubin, Direct: 0.14 mg/dL (ref 0.00–0.40)
CHLORIDE: 103 mmol/L (ref 96–106)
CO2: 25 mmol/L (ref 20–29)
CREATININE: 1.27 mg/dL (ref 0.76–1.27)
Calcium: 9.7 mg/dL (ref 8.7–10.2)
GFR calc Af Amer: 72 mL/min/{1.73_m2} (ref >59)
GFR, EST NON AFRICAN AMERICAN: 62 mL/min/{1.73_m2} (ref >59)
Glucose: 82 mg/dL (ref 65–99)
Potassium: 4.4 mmol/L (ref 3.5–5.2)
Sodium: 142 mmol/L (ref 134–144)
Total Protein: 7.8 g/dL (ref 6.0–8.5)

## 2017-11-07 LAB — HEPATITIS C ANTIBODY: Hep C Virus Ab: <0.1 s/co ratio (ref 0.0–0.9)

## 2017-11-07 LAB — THYROID PANEL WITH TSH
FREE THYROXINE INDEX: 2 (ref 1.2–4.9)
T3 UPTAKE RATIO: 24 % (ref 24–39)
T4 TOTAL: 8.5 ug/dL (ref 4.5–12.0)
TSH: 12.22 u[IU]/mL — AB (ref 0.450–4.500)

## 2017-11-07 LAB — HIV ANTIBODY (ROUTINE TESTING W REFLEX): HIV Screen 4th Generation wRfx: NONREACTIVE

## 2017-11-09 ENCOUNTER — Other Ambulatory Visit: Payer: Self-pay | Admitting: Family Medicine

## 2017-11-09 ENCOUNTER — Telehealth: Payer: Self-pay

## 2017-11-09 DIAGNOSIS — E039 Hypothyroidism, unspecified: Secondary | ICD-10-CM

## 2017-11-09 DIAGNOSIS — I10 Essential (primary) hypertension: Secondary | ICD-10-CM

## 2017-11-09 DIAGNOSIS — E782 Mixed hyperlipidemia: Secondary | ICD-10-CM

## 2017-11-09 MED ORDER — LEVOTHYROXINE SODIUM 125 MCG PO TABS: 125.0000 ug | ORAL_TABLET | Freq: Every day | ORAL | 1 refills | Status: DC

## 2017-11-09 MED ORDER — AMLODIPINE BESYLATE 5 MG PO TABS: 5.0000 mg | ORAL_TABLET | Freq: Every day | ORAL | 2 refills | Status: DC

## 2017-11-09 MED ORDER — LISINOPRIL 10 MG PO TABS: 10.0000 mg | ORAL_TABLET | Freq: Every day | ORAL | 2 refills | Status: DC

## 2017-11-09 MED ORDER — ATORVASTATIN CALCIUM 10 MG PO TABS: 10.0000 mg | ORAL_TABLET | Freq: Every day | ORAL | 2 refills | Status: DC

## 2017-11-09 NOTE — Telephone Encounter (Signed)
Contacted pt to go over lab results was unable to reach pt due to phone being busy  If pt calls for results please give them:  Thyroid function is not controlled on current levothyroxine dose. Your dose will be increased recommend follow up in 6 weeks.  HIV is negative.  Hepatitis C is negative.  Cholesterol was elevated. This can increase your risk of heart disease overtime. You will be prescribed atorvastatin. Start eating a diet low in saturated fat. Limit your intake of fried foods, red meats, and whole milk. Increase activity.

## 2018-02-05 ENCOUNTER — Other Ambulatory Visit: Payer: Self-pay

## 2018-02-05 DIAGNOSIS — E039 Hypothyroidism, unspecified: Secondary | ICD-10-CM

## 2018-02-05 MED ORDER — LEVOTHYROXINE SODIUM 125 MCG PO TABS: 125.0000 ug | ORAL_TABLET | Freq: Every day | ORAL | 1 refills | Status: DC

## 2018-02-07 ENCOUNTER — Ambulatory Visit: Payer: Self-pay | Attending: Family Medicine | Admitting: Physician Assistant

## 2018-02-07 VITALS — BP 151/94 | HR 68 | Temp 99.2°F | Resp 16 | Ht 65.0 in | Wt 149.0 lb

## 2018-02-07 DIAGNOSIS — E782 Mixed hyperlipidemia: Secondary | ICD-10-CM

## 2018-02-07 DIAGNOSIS — I10 Essential (primary) hypertension: Secondary | ICD-10-CM

## 2018-02-07 DIAGNOSIS — Z760 Encounter for issue of repeat prescription: Secondary | ICD-10-CM | POA: Insufficient documentation

## 2018-02-07 DIAGNOSIS — Z79899 Other long term (current) drug therapy: Secondary | ICD-10-CM | POA: Insufficient documentation

## 2018-02-07 DIAGNOSIS — E039 Hypothyroidism, unspecified: Secondary | ICD-10-CM

## 2018-02-07 MED ORDER — AMLODIPINE BESYLATE 10 MG PO TABS
10.0000 mg | ORAL_TABLET | Freq: Every day | ORAL | 3 refills | Status: DC
Start: 2018-02-07 — End: 2018-04-30

## 2018-02-07 MED ORDER — ATORVASTATIN CALCIUM 10 MG PO TABS: 10.0000 mg | ORAL_TABLET | Freq: Every day | ORAL | 2 refills | Status: DC

## 2018-02-07 MED ORDER — LISINOPRIL 10 MG PO TABS: 10.0000 mg | ORAL_TABLET | Freq: Every day | ORAL | 2 refills | Status: DC

## 2018-02-07 NOTE — Progress Notes (Signed)
Pt. Is here for medication refill.  

## 2018-02-07 NOTE — Progress Notes (Signed)
Patient ID: PINK MAYE, male   DOB: 30-Jun-1961, 57 y.o.   MRN: [id number]   James [name], is a 57 y.o. male  [id number]  [id number]  DOB - 09/15/1961  Subjective:  Chief Complaint and HPI: James [name] is a 57 y.o. male here today to get meds RF and have thyroid rechecked.  No s/sx hypo/hyperthyroid.  Compliant with meds.  When he checks BP OOO, it is always about like today/high.  Denies HA/CP/dizziness.  Recovering alcoholic X 18 years!  ROS:   Constitutional:  No f/c, No night sweats, No unexplained weight loss. EENT:  No vision changes, No blurry vision, No hearing changes. No mouth, throat, or ear problems.  Respiratory: No cough, No SOB Cardiac: No CP, no palpitations GI:  No abd pain, No N/V/D. GU: No Urinary s/sx Musculoskeletal: No joint pain Neuro: No headache, no dizziness, no motor weakness.  Skin: No rash Endocrine:  No polydipsia. No polyuria.  Psych: Denies SI/HI  No problems updated.  ALLERGIES: No Known Allergies  PAST MEDICAL HISTORY: Past Medical History:  Diagnosis Date  . Allergy   . Hypertension   . Substance abuse (HCC)   . Thyroid disease     MEDICATIONS AT HOME: Prior to Admission medications   Medication Sig Start Date End Date Taking? Authorizing Provider  atorvastatin (LIPITOR) 10 MG tablet Take 1 tablet (10 mg total) by mouth daily. 02/07/18  Yes Georgian Co M, PA-C  ketotifen (ZADITOR) 0.025 % ophthalmic solution 1 drop 2 (two) times daily.   Yes [provider]  levothyroxine (SYNTHROID, LEVOTHROID) 125 MCG tablet Take 1 tablet (125 mcg total) by mouth daily. 02/05/18  Yes Hoy Register, MD  lisinopril (PRINIVIL,ZESTRIL) 10 MG tablet Take 1 tablet (10 mg total) by mouth daily. 02/07/18  Yes Georgian Co M, PA-C  amLODipine (NORVASC) 10 MG tablet Take 1 tablet (10 mg total) by mouth daily. 02/07/18   Anders Simmonds, PA-C  cetirizine (ZYRTEC) 10 MG tablet Take 10 mg by mouth daily.    [provider]      Objective:  EXAM:   Vitals:   02/07/18 1342  BP: (!) 151/94  Pulse: 68  Resp: 16  Temp: 99.2 F (37.3 C)  TempSrc: Oral  SpO2: 99%  Weight: 149 lb (67.6 kg)  Height:  (1.651 m)    General appearance : A&OX3. NAD. Non-toxic-appearing HEENT: Atraumatic and Normocephalic.  PERRLA. EOM intact.  Neck: supple, no JVD. No cervical lymphadenopathy. No thyromegaly Chest/Lungs:  Breathing-non-labored, Good air entry bilaterally, breath sounds normal without rales, rhonchi, or wheezing  CVS: S1 S2 regular, no murmurs, gallops, rubs  Extremities: Bilateral Lower Ext shows no edema, both legs are warm to touch with = pulse throughout Neurology:  CN II-XII grossly intact, Non focal.   Psych:  TP linear. J/I WNL. Normal speech. Appropriate eye contact and affect.  Skin:  No Rash  Data Review No results found for: HGBA1C   Assessment & Plan   1. Essential hypertension uncontrolled continue- lisinopril (PRINIVIL,ZESTRIL) 10 MG tablet; Take 1 tablet (10 mg total) by mouth daily.  Dispense: 30 tablet; Refill: 2 Increase dose- amLODipine (NORVASC) 10 MG tablet; Take 1 tablet (10 mg total) by mouth daily.  Dispense: 90 tablet; Refill: 3 Check blood pressures 3 times weekly and record.   Bring to next visit.  Goal is <130/85  2. Mixed hyperlipidemia continue - atorvastatin (LIPITOR) 10 MG tablet; Take 1 tablet (10 mg total) by mouth daily.  Dispense: 30 tablet;  Refill: 2  3. Hypothyroidism, unspecified type Will check level and will adjust if needed - TSH   Patient have been counseled extensively about nutrition and exercise  Return in about 3 months (around 05/10/2018) for assign new PCP; f/up BP/lipids/thyroid.  The patient was given clear instructions to go to ER or return to medical center if symptoms don't improve, worsen or new problems develop. The patient verbalized understanding. The patient was told to call to get lab results if they haven't heard anything in the  next week.     Georgian Co, PA-C [facility] [place], [place] [phone]   02/07/2018, 1:54 PM

## 2018-02-07 NOTE — Patient Instructions (Signed)
Check blood pressures 3 times weekly and record.   Bring to next visit.  Goal is <130/85  Hypertension Hypertension is another name for high blood pressure. High blood pressure forces your heart to work harder to pump blood. This can cause problems over time. There are two numbers in a blood pressure reading. There is a top number (systolic) over a bottom number (diastolic). It is best to have a blood pressure below 120/80. Healthy choices can help lower your blood pressure. You may need medicine to help lower your blood pressure if:  Your blood pressure cannot be lowered with healthy choices.  Your blood pressure is higher than 130/80.  Follow these instructions at home: Eating and drinking  If directed, follow the DASH eating plan. This diet includes: ? Filling half of your plate at each meal with fruits and vegetables. ? Filling one quarter of your plate at each meal with whole grains. Whole grains include whole wheat pasta, brown rice, and whole grain bread. ? Eating or drinking low-fat dairy products, such as skim milk or low-fat yogurt. ? Filling one quarter of your plate at each meal with low-fat (lean) proteins. Low-fat proteins include fish, skinless chicken, eggs, beans, and tofu. ? Avoiding fatty meat, cured and processed meat, or chicken with skin. ? Avoiding premade or processed food.  Eat less than 1,500 mg of salt (sodium) a day.  Limit alcohol use to no more than 1 drink a day for nonpregnant women and 2 drinks a day for men. One drink equals 12 oz of beer, 5 oz of wine, or 1 oz of hard liquor. Lifestyle  Work with your doctor to stay at a healthy weight or to lose weight. Ask your doctor what the best weight is for you.  Get at least 30 minutes of exercise that causes your heart to beat faster (aerobic exercise) most days of the week. This may include walking, swimming, or biking.  Get at least 30 minutes of exercise that strengthens your muscles (resistance exercise)  at least 3 days a week. This may include lifting weights or pilates.  Do not use any products that contain nicotine or tobacco. This includes cigarettes and e-cigarettes. If you need help quitting, ask your doctor.  Check your blood pressure at home as told by your doctor.  Keep all follow-up visits as told by your doctor. This is important. Medicines  Take over-the-counter and prescription medicines only as told by your doctor. Follow directions carefully.  Do not skip doses of blood pressure medicine. The medicine does not work as well if you skip doses. Skipping doses also puts you at risk for problems.  Ask your doctor about side effects or reactions to medicines that you should watch for. Contact a doctor if:  You think you are having a reaction to the medicine you are taking.  You have headaches that keep coming back (recurring).  You feel dizzy.  You have swelling in your ankles.  You have trouble with your vision. Get help right away if:  You get a very bad headache.  You start to feel confused.  You feel weak or numb.  You feel faint.  You get very bad pain in your: ? Chest. ? Belly (abdomen).  You throw up (vomit) more than once.  You have trouble breathing. Summary  Hypertension is another name for high blood pressure.  Making healthy choices can help lower blood pressure. If your blood pressure cannot be controlled with healthy choices, you may  need to take medicine. This information is not intended to replace advice given to you by your health care provider. Make sure you discuss any questions you have with your health care provider. Document Released: 03/13/2008 Document Revised: 08/23/2016 Document Reviewed: 08/23/2016 Elsevier Interactive Patient Education  [employer].

## 2018-02-08 ENCOUNTER — Other Ambulatory Visit: Payer: Self-pay | Admitting: Physician Assistant

## 2018-02-08 LAB — TSH: TSH: 0.3 u[IU]/mL — AB (ref 0.450–4.500)

## 2018-02-08 MED ORDER — LEVOTHYROXINE SODIUM 100 MCG PO TABS: 100.0000 ug | ORAL_TABLET | Freq: Every day | ORAL | 3 refills | Status: DC

## 2018-02-11 ENCOUNTER — Telehealth: Payer: Self-pay

## 2018-02-11 NOTE — Telephone Encounter (Signed)
CMA attempt to call patient to inform on results. No answer and phone line was busy.    Will try again.

## 2018-02-13 NOTE — Telephone Encounter (Signed)
Attempt to call patient. Phone line stay busy. Attempt to call patient contact emergency. Wrong phone number input.   Letter will be send out to patient.

## 2018-03-26 ENCOUNTER — Other Ambulatory Visit: Payer: Self-pay | Admitting: Family Medicine

## 2018-04-01 ENCOUNTER — Telehealth: Payer: Self-pay | Admitting: Family Medicine

## 2018-04-01 ENCOUNTER — Other Ambulatory Visit: Payer: Self-pay | Admitting: Family Medicine

## 2018-04-01 NOTE — Telephone Encounter (Signed)
1 page, paperwork received through fax 04-01-18.

## 2018-04-02 ENCOUNTER — Other Ambulatory Visit: Payer: Self-pay

## 2018-04-02 DIAGNOSIS — I10 Essential (primary) hypertension: Secondary | ICD-10-CM

## 2018-04-02 MED ORDER — LISINOPRIL 10 MG PO TABS: 10.0000 mg | ORAL_TABLET | Freq: Every day | ORAL | 2 refills | Status: DC

## 2018-04-02 NOTE — Telephone Encounter (Signed)
Patient was provided refills on 02/07/18. Patient needs to establish care for additional refills.

## 2018-04-03 ENCOUNTER — Other Ambulatory Visit: Payer: Self-pay | Admitting: Family Medicine

## 2018-04-30 ENCOUNTER — Ambulatory Visit: Payer: Self-pay | Attending: Family Medicine | Admitting: Family Medicine

## 2018-04-30 ENCOUNTER — Encounter: Payer: Self-pay | Admitting: Family Medicine

## 2018-04-30 VITALS — BP 136/76 | HR 47 | Temp 97.5°F | Ht 65.0 in | Wt 144.8 lb

## 2018-04-30 DIAGNOSIS — Z79899 Other long term (current) drug therapy: Secondary | ICD-10-CM | POA: Insufficient documentation

## 2018-04-30 DIAGNOSIS — E782 Mixed hyperlipidemia: Secondary | ICD-10-CM | POA: Insufficient documentation

## 2018-04-30 DIAGNOSIS — L570 Actinic keratosis: Secondary | ICD-10-CM | POA: Insufficient documentation

## 2018-04-30 DIAGNOSIS — Z1211 Encounter for screening for malignant neoplasm of colon: Secondary | ICD-10-CM

## 2018-04-30 DIAGNOSIS — I10 Essential (primary) hypertension: Secondary | ICD-10-CM | POA: Insufficient documentation

## 2018-04-30 DIAGNOSIS — Z79890 Hormone replacement therapy (postmenopausal): Secondary | ICD-10-CM | POA: Insufficient documentation

## 2018-04-30 DIAGNOSIS — E039 Hypothyroidism, unspecified: Secondary | ICD-10-CM | POA: Insufficient documentation

## 2018-04-30 MED ORDER — LISINOPRIL 10 MG PO TABS: 10.0000 mg | ORAL_TABLET | Freq: Every day | ORAL | 6 refills | Status: DC

## 2018-04-30 MED ORDER — AMLODIPINE BESYLATE 10 MG PO TABS: 10.0000 mg | ORAL_TABLET | Freq: Every day | ORAL | 6 refills | Status: DC

## 2018-04-30 NOTE — Progress Notes (Signed)
Subjective:  Patient ID: James [name], male    DOB: 1961/01/06  Age: 57 y.o. MRN: [id number]  CC: Hypothyroidism   HPI James [name] is a 57 year old male with a history of hypothyroidism, hypertension, hyperlipidemia who presents today to establish care with me. His last TSH was suppressed and his dose of levothyroxine was decreased.  He endorses compliance with his medication and is doing well on his antihypertensive and statin with no complaints of myalgias. He has been keeping a blood pressure log which reveals blood pressures have been controlled at home. He denies chest pain, shortness of breath, pedal edema. He does have a brownish discoloration in his left upper arm and is wanting this looked at; patient has not changed in size and has been present for a while but he is unsure of the duration. He is not up-to-date on his colon cancer screening.  Past Medical History:  Diagnosis Date  . Allergy   . Hypertension   . Substance abuse ([place])   . Thyroid disease     Past Surgical History:  Procedure Laterality Date  . FRACTURE SURGERY    . THYROID SURGERY  2006  . WISDOM TOOTH EXTRACTION      No Known Allergies   Outpatient Medications Prior to Visit  Medication Sig Dispense Refill  . atorvastatin (LIPITOR) 10 MG tablet Take 1 tablet (10 mg total) by mouth daily. 30 tablet 2  . cetirizine (ZYRTEC) 10 MG tablet Take 10 mg by mouth daily.    Marland Kitchen ketotifen (ZADITOR) 0.025 % ophthalmic solution 1 drop 2 (two) times daily.    Marland Kitchen levothyroxine (SYNTHROID, LEVOTHROID) 100 MCG tablet Take 1 tablet (100 mcg total) by mouth daily. 90 tablet 3  . amLODipine (NORVASC) 10 MG tablet Take 1 tablet (10 mg total) by mouth daily. 90 tablet 3  . lisinopril (PRINIVIL,ZESTRIL) 10 MG tablet Take 1 tablet (10 mg total) by mouth daily. 30 tablet 2   No facility-administered medications prior to visit.     ROS Review of Systems  Constitutional: Negative for activity change and appetite change.   HENT: Negative for sinus pressure and sore throat.   Eyes: Negative for visual disturbance.  Respiratory: Negative for cough, chest tightness and shortness of breath.   Cardiovascular: Negative for chest pain and leg swelling.  Gastrointestinal: Negative for abdominal distention, abdominal pain, constipation and diarrhea.  Endocrine: Negative.   Genitourinary: Negative for dysuria.  Musculoskeletal: Negative for joint swelling and myalgias.  Skin: Positive for rash.  Allergic/Immunologic: Negative.   Neurological: Negative for weakness, light-headedness and numbness.  Psychiatric/Behavioral: Negative for dysphoric mood and suicidal ideas.    Objective:  BP 136/76   Pulse (!) 47   Temp (!) 97.5 F (36.4 C) (Oral)   Ht '5\' 5"'  (1.651 m)   Wt 144 lb 12.8 oz (65.7 kg)   SpO2 100%   BMI 24.10 kg/m   BP/Weight 04/30/2018 02/07/2018 01/15/8118  Systolic BP 147 829 562  Diastolic BP 76 94 94  Wt. (Lbs) 144.8 149 145.4  BMI 24.1 24.79 24.2      Physical Exam  Constitutional: He is oriented to person, place, and time. He appears well-developed and well-nourished.  Cardiovascular: Normal heart sounds and intact distal pulses. Bradycardia present.  No murmur heard. Pulmonary/Chest: Effort normal and breath sounds normal. He has no wheezes. He has no rales. He exhibits no tenderness.  Abdominal: Soft. Bowel sounds are normal. He exhibits no distension and no mass. There is no tenderness.  Musculoskeletal: Normal range of motion.  Neurological: He is alert and oriented to person, place, and time.  Skin:  Actinic keratosis lesion on left upper arm  Psychiatric: He has a normal mood and affect.    CMP Latest Ref Rng & Units 11/06/2017 09/14/2017 09/24/2015  Glucose 65 - 99 mg/dL 82 105(H) 82  BUN 6 - 24 mg/dL '17 16 18  ' Creatinine 0.76 - 1.27 mg/dL 1.27 1.00 1.01  Sodium 134 - 144 mmol/L 142 144 137  Potassium 3.5 - 5.2 mmol/L 4.4 3.6 4.2  Chloride 96 - 106 mmol/L 103 103 101  CO2 20 -  29 mmol/L 25 - 29  Calcium 8.7 - 10.2 mg/dL 9.7 - 9.5  Total Protein 6.0 - 8.5 g/dL 7.8 - 7.5  Total Bilirubin 0.0 - 1.2 mg/dL 0.4 - 0.7  Alkaline Phos 39 - 117 IU/L 88 - 71  AST 0 - 40 IU/L 21 - 19  ALT 0 - 44 IU/L 16 - 19    Lipid Panel     Component Value Date/Time   CHOL 235 (H) 11/06/2017 0854   TRIG 56 11/06/2017 0854   HDL 62 11/06/2017 0854   CHOLHDL 3.8 11/06/2017 0854   CHOLHDL 4.0 09/24/2015 1504   VLDL 13 09/24/2015 1504   LDLCALC 162 (H) 11/06/2017 0854   LDLDIRECT 84 05/20/2010 2031    Lab Results  Component Value Date   TSH 0.300 (L) 02/07/2018    Assessment & Plan:   1. Essential hypertension, benign - amLODipine (NORVASC) 10 MG tablet; Take 1 tablet (10 mg total) by mouth daily.  Dispense: 30 tablet; Refill: 6 - lisinopril (PRINIVIL,ZESTRIL) 10 MG tablet; Take 1 tablet (10 mg total) by mouth daily.  Dispense: 30 tablet; Refill: 6 Counseled on blood pressure goal of less than 130/80, low-sodium, DASH diet, medication compliance, 150 minutes of moderate intensity exercise per week. Discussed medication compliance, adverse effects. - CMP14+EGFR - Lipid panel  2. Mixed hyperlipidemia Deviously uncontrolled Currently on atorvastatin We will send of lipid panel and  adjust regimen accordingly  3. Hypothyroidism, unspecified type Last TSH was suppressed Thyroid panel sent off - T4, free - TSH  4. Screening for colon cancer - Ambulatory referral to Gastroenterology  5. Actinic keratosis Patient reassured   Meds ordered this encounter  Medications  . amLODipine (NORVASC) 10 MG tablet    Sig: Take 1 tablet (10 mg total) by mouth daily.    Dispense:  30 tablet    Refill:  6  . lisinopril (PRINIVIL,ZESTRIL) 10 MG tablet    Sig: Take 1 tablet (10 mg total) by mouth daily.    Dispense:  30 tablet    Refill:  6    Follow-up: Return in about 6 months (around 10/31/2018) for Follow-up of chronic medical conditions.   Charlott Rakes MD

## 2018-04-30 NOTE — Progress Notes (Signed)
Patient will like printed prescriptions.

## 2018-04-30 NOTE — Patient Instructions (Addendum)
Hypothyroidism Hypothyroidism is a disorder of the thyroid. The thyroid is a large gland that is located in the lower front of the neck. The thyroid releases hormones that control how the body works. With hypothyroidism, the thyroid does not make enough of these hormones. What are the causes? Causes of hypothyroidism may include:  Viral infections.  Pregnancy.  Your own defense system (immune system) attacking your thyroid.  Certain medicines.  Birth defects.  Past radiation treatments to your head or neck.  Past treatment with radioactive iodine.  Past surgical removal of part or all of your thyroid.  Problems with the gland that is located in the center of your brain (pituitary).  What are the signs or symptoms? Signs and symptoms of hypothyroidism may include:  Feeling as though you have no energy (lethargy).  Inability to tolerate cold.  Weight gain that is not explained by a change in diet or exercise habits.  Dry skin.  Coarse hair.  Menstrual irregularity.  Slowing of thought processes.  Constipation.  Sadness or depression.  How is this diagnosed? Your health care provider may diagnose hypothyroidism with blood tests and ultrasound tests. How is this treated? Hypothyroidism is treated with medicine that replaces the hormones that your body does not make. After you begin treatment, it may take several weeks for symptoms to go away. Follow these instructions at home:  Take medicines only as directed by your health care provider.  If you start taking any new medicines, tell your health care provider.  Keep all follow-up visits as directed by your health care provider. This is important. As your condition improves, your dosage needs may change. You will need to have blood tests regularly so that your health care provider can watch your condition. Contact a health care provider if:  Your symptoms do not get better with treatment.  You are taking thyroid  replacement medicine and: ? You sweat excessively. ? You have tremors. ? You feel anxious. ? You lose weight rapidly. ? You cannot tolerate heat. ? You have emotional swings. ? You have diarrhea. ? You feel weak. Get help right away if:  You develop chest pain.  You develop an irregular heartbeat.  You develop a rapid heartbeat. This information is not intended to replace advice given to you by your health care provider. Make sure you discuss any questions you have with your health care provider. Document Released: 09/25/2005 Document Revised: 03/02/2016 Document Reviewed: 02/10/2014 Elsevier Interactive Patient Education  2018 [employer].  Actinic Keratosis An actinic keratosis is a precancerous growth on the skin. This means that it could develop into skin cancer if it is not treated. About 1% of these growths (actinic keratoses) turn into skin cancer within one year if they are not treated. It is important to have all of these growths evaluated to determine the best treatment approach. What are the causes? This condition is caused by getting too much ultraviolet (UV) radiation from the sun or other UV light sources. What increases the risk? The following factors may make you more likely to develop this condition:  Having light-colored skin and blue eyes.  Having blonde or red hair.  Spending a lot of time in the sun.  Inadequate skin protection when outdoors. This may include: ? Not using sunscreen properly. ? Not covering up skin that is exposed to sunlight.  Aging. The risk of developing an actinic keratosis increases with age.  What are the signs or symptoms? Actinic keratoses look like scaly, rough  spots of skin.They can be as small as a pinhead or as big as a quarter. They may itch, hurt, or feel sensitive. In most cases, the growths become red. In some cases, they may be skin-colored, light tan, dark tan, pink, or a combination of any of these colors. There may be  a small piece of pink or gray skin (skin tag) growing from the actinic keratosis. In some cases, it may be easier to notice actinic keratoses by feeling them, rather than seeing them. Actinic keratoses appear most often on areas of skin that get a lot of sun exposure, including the scalp, face, ears, lips, upper back, forearms, and the backs of the hands. Sometimes, actinic keratoses disappear, but many reappear a few days to a few weeks later. How is this diagnosed? This condition is usually diagnosed with a physical exam. A tissue sample may be removed from the actinic keratosis and examined under a microscope (biopsy). How is this treated?  Treatment for this condition may include:  Scraping off the actinic keratosis (curettage).  Freezing the actinic keratosis with liquid nitrogen (cryosurgery). This causes the growth to eventually fall off the skin.  Applying medicated creams or gels to destroy the cells in the growth.  Applying chemicals to the actinic keratosis to make the outer layers of skin peel off (chemical peel).  Photodynamic therapy. In this procedure, medicated cream is applied to the actinic keratosis. This cream increases your skin's sensitivity to light. Then, a strong light is aimed at the actinic keratosis to destroy cells in the growth.  Follow these instructions at home: Skin care  Apply cool, wet cloths (cool compresses) to the affected areas.  Do not scratch your skin.  Check your skin regularly for any growths, especially growths that: ? Start to itch or bleed. ? Change in size, shape, or color. Caring for the treated area  Keep the treated area clean and dry as told by your health care provider.  Do not apply any medicine, cream, or lotion to the treated area unless your health care provider tells you to do that.  Do not pick at blisters or try to break them open. This can cause infection and scarring.  If you have red or irritated skin after treatment,  follow instructions from your health care provider about how to take care of the treated area. Make sure you: ? Wash your hands with soap and water before you change your bandage (dressing). If soap and water are not available, use hand sanitizer. ? Change your dressing as told by your health care provider.  If you have red or irritated skin after treatment, check your treated area every day for signs of infection. Check for: ? Swelling, pain, or more redness. ? Fluid or blood. ? Warmth. ? Pus or a bad smell. General instructions  Take over-the-counter and prescription medicines only as told by your health care provider.  Return to your normal activities as told by your health care provider. Ask your health care provider what activities are safe for you.  Do not use any tobacco products, such as cigarettes, chewing tobacco, and e-cigarettes. If you need help quitting, ask your health care provider.  Have a skin exam done every year by a health care provider who is a skin conditions specialist (dermatologist).  Keep all follow-up visits as told by your health care provider. This is important. How is this prevented?  Do not get sunburns.  Try to avoid the sun between 10:00 a.m.  and 4:00 p.m. This is when the UV light is the strongest.  Use a sunscreen or sunblock with SPF 30 (sun protection factor 30) or greater.  Apply sunscreen before you are exposed to sunlight, and reapply periodically as often as directed by the instructions on the sunscreen container.  Always wear sunglasses that have UV protection, and always wear hats and clothing to protect your skin from sunlight.  When possible, avoid medicines that increase your sensitivity to sunlight. These include: ? Certain antibiotic medicines. ? Certain water pills (diuretics). ? Certain prescription medicines that are used to treat acne (retinoids).  Do not use tanning beds or other indoor tanning devices. Contact a health care  provider if:  You notice any changes or new growths on your skin.  You have swelling, pain, or more redness around your treated area.  You have fluid or blood coming from your treated area.  Your treated area feels warm to the touch.  You have pus or a bad smell coming from your treated area.  You have a fever.  You have a blister that becomes large and painful. This information is not intended to replace advice given to you by your health care provider. Make sure you discuss any questions you have with your health care provider. Document Released: 12/22/2008 Document Revised: 05/26/2016 Document Reviewed: 06/05/2015 Elsevier Interactive Patient Education  [employer].

## 2018-05-01 ENCOUNTER — Other Ambulatory Visit: Payer: Self-pay | Admitting: Family Medicine

## 2018-05-01 LAB — CMP14+EGFR
ALT: 20 IU/L (ref 0–44)
AST: 20 IU/L (ref 0–40)
Albumin/Globulin Ratio: 1.6 (ref 1.2–2.2)
Albumin: 4.6 g/dL (ref 3.5–5.5)
Alkaline Phosphatase: 77 IU/L (ref 39–117)
BILIRUBIN TOTAL: 0.4 mg/dL (ref 0.0–1.2)
BUN / CREAT RATIO: 13 (ref 9–20)
BUN: 16 mg/dL (ref 6–24)
CALCIUM: 9.8 mg/dL (ref 8.7–10.2)
CO2: 24 mmol/L (ref 20–29)
CREATININE: 1.27 mg/dL (ref 0.76–1.27)
Chloride: 105 mmol/L (ref 96–106)
GFR calc non Af Amer: 62 mL/min/{1.73_m2} (ref >59)
GFR, EST AFRICAN AMERICAN: 72 mL/min/{1.73_m2} (ref >59)
GLUCOSE: 80 mg/dL (ref 65–99)
Globulin, Total: 2.9 g/dL (ref 1.5–4.5)
Potassium: 4.5 mmol/L (ref 3.5–5.2)
Sodium: 142 mmol/L (ref 134–144)
TOTAL PROTEIN: 7.5 g/dL (ref 6.0–8.5)

## 2018-05-01 LAB — LIPID PANEL
CHOLESTEROL TOTAL: 166 mg/dL (ref 100–199)
Chol/HDL Ratio: 2.8 ratio (ref 0.0–5.0)
HDL: 60 mg/dL (ref >39)
LDL Calculated: 93 mg/dL (ref 0–99)
Triglycerides: 65 mg/dL (ref 0–149)
VLDL CHOLESTEROL CAL: 13 mg/dL (ref 5–40)

## 2018-05-01 LAB — T4, FREE: FREE T4: 1.41 ng/dL (ref 0.82–1.77)

## 2018-05-01 LAB — TSH: TSH: 5.05 u[IU]/mL — AB (ref 0.450–4.500)

## 2018-05-01 MED ORDER — LEVOTHYROXINE SODIUM 112 MCG PO TABS: 112.0000 ug | ORAL_TABLET | Freq: Every day | ORAL | 3 refills | Status: DC

## 2018-05-02 ENCOUNTER — Telehealth: Payer: Self-pay | Admitting: Family Medicine

## 2018-05-02 NOTE — Telephone Encounter (Signed)
Patient stopped by the office and requested to leave a message for the pcp. Patient wanted [facility] for all medications going forward. Please fu at your earliest convenience.

## 2018-05-03 NOTE — Telephone Encounter (Signed)
Noted. Will route to PCP 

## 2018-05-03 NOTE — Telephone Encounter (Signed)
At his last visit he was in agreement with electronic scripts due to having to wait for his thyroid labs prior to refill.  Okay to proceed with pointing out his prescriptions for him.

## 2018-06-06 ENCOUNTER — Other Ambulatory Visit: Payer: Self-pay | Admitting: Physician Assistant

## 2018-06-06 DIAGNOSIS — E782 Mixed hyperlipidemia: Secondary | ICD-10-CM

## 2018-12-26 ENCOUNTER — Other Ambulatory Visit: Payer: Self-pay | Admitting: Family Medicine

## 2018-12-26 DIAGNOSIS — I10 Essential (primary) hypertension: Secondary | ICD-10-CM

## 2019-02-10 ENCOUNTER — Other Ambulatory Visit: Payer: Self-pay | Admitting: Family Medicine

## 2019-02-10 DIAGNOSIS — E782 Mixed hyperlipidemia: Secondary | ICD-10-CM

## 2019-02-11 ENCOUNTER — Other Ambulatory Visit: Payer: Self-pay | Admitting: Family Medicine

## 2019-02-11 DIAGNOSIS — E782 Mixed hyperlipidemia: Secondary | ICD-10-CM

## 2019-02-13 ENCOUNTER — Other Ambulatory Visit: Payer: Self-pay | Admitting: Family Medicine

## 2019-02-13 DIAGNOSIS — E782 Mixed hyperlipidemia: Secondary | ICD-10-CM

## 2019-03-04 ENCOUNTER — Other Ambulatory Visit: Payer: Self-pay | Admitting: Family Medicine

## 2019-03-04 DIAGNOSIS — I10 Essential (primary) hypertension: Secondary | ICD-10-CM

## 2019-03-08 ENCOUNTER — Other Ambulatory Visit: Payer: Self-pay | Admitting: Physician Assistant

## 2019-03-08 DIAGNOSIS — I10 Essential (primary) hypertension: Secondary | ICD-10-CM

## 2019-03-10 ENCOUNTER — Other Ambulatory Visit: Payer: Self-pay | Admitting: Family Medicine

## 2019-03-10 DIAGNOSIS — I10 Essential (primary) hypertension: Secondary | ICD-10-CM

## 2019-03-13 ENCOUNTER — Other Ambulatory Visit: Payer: Self-pay | Admitting: Physician Assistant

## 2019-03-13 ENCOUNTER — Other Ambulatory Visit: Payer: Self-pay | Admitting: Family Medicine

## 2019-03-13 DIAGNOSIS — I10 Essential (primary) hypertension: Secondary | ICD-10-CM

## 2019-03-15 ENCOUNTER — Other Ambulatory Visit: Payer: Self-pay

## 2019-03-15 ENCOUNTER — Ambulatory Visit (HOSPITAL_COMMUNITY)
Admission: EM | Admit: 2019-03-15 | Discharge: 2019-03-15 | Disposition: A | Discharge status: Home / Self Care | Payer: Self-pay | Attending: Family Medicine | Admitting: Family Medicine

## 2019-03-15 ENCOUNTER — Encounter (HOSPITAL_COMMUNITY): Payer: Self-pay

## 2019-03-15 DIAGNOSIS — E039 Hypothyroidism, unspecified: Secondary | ICD-10-CM

## 2019-03-15 DIAGNOSIS — Z760 Encounter for issue of repeat prescription: Secondary | ICD-10-CM

## 2019-03-15 DIAGNOSIS — K029 Dental caries, unspecified: Secondary | ICD-10-CM

## 2019-03-15 DIAGNOSIS — I10 Essential (primary) hypertension: Secondary | ICD-10-CM

## 2019-03-15 DIAGNOSIS — E785 Hyperlipidemia, unspecified: Secondary | ICD-10-CM

## 2019-03-15 DIAGNOSIS — E782 Mixed hyperlipidemia: Secondary | ICD-10-CM

## 2019-03-15 MED ORDER — ATORVASTATIN CALCIUM 10 MG PO TABS: 10.0000 mg | ORAL_TABLET | Freq: Every day | ORAL | 1 refills | Status: DC

## 2019-03-15 MED ORDER — LEVOTHYROXINE SODIUM 112 MCG PO TABS: 112.0000 ug | ORAL_TABLET | Freq: Every day | ORAL | 3 refills | Status: DC

## 2019-03-15 MED ORDER — LISINOPRIL 10 MG PO TABS: 10.0000 mg | ORAL_TABLET | Freq: Every day | ORAL | 1 refills | Status: DC

## 2019-03-15 MED ORDER — AMLODIPINE BESYLATE 10 MG PO TABS: 10.0000 mg | ORAL_TABLET | Freq: Every day | ORAL | 1 refills | Status: DC

## 2019-03-15 NOTE — ED Provider Notes (Signed)
[facility]    CSN: [id number] Arrival date & time: 03/15/19  1628     History   Chief Complaint Chief Complaint  Patient presents with  . Medication Refill    HPI James [name] is a 58 y.o. male.   58 yo established patient, male, with hypertension  And hyperlipidemia and out of medication.  He ran out of medication about 2 weeks ago.  Needs PCP.  No new sx.   Works for [employer] making [employer].  Nonsmoker.     Past Medical History:  Diagnosis Date  . Allergy   . Hypertension   . Substance abuse (HCC)   . Thyroid disease     Patient Active Problem List   Diagnosis Date Noted  . Mixed hyperlipidemia 09/24/2015  . Hypothyroidism 12/23/2012  . Essential hypertension, benign 12/23/2012  . Other and unspecified hyperlipidemia 12/23/2012  . Encounter for long-term (current) use of other medications 12/23/2012    Past Surgical History:  Procedure Laterality Date  . FRACTURE SURGERY    . THYROID SURGERY  2006  . WISDOM TOOTH EXTRACTION         Home Medications    Prior to Admission medications   Medication Sig Start Date End Date Taking? Authorizing Provider  amLODipine (NORVASC) 10 MG tablet Take 1 tablet (10 mg total) by mouth daily. 03/15/19   Elvina SidleLauenstein, Amiree No, MD  atorvastatin (LIPITOR) 10 MG tablet Take 1 tablet (10 mg total) by mouth daily. 03/15/19   Elvina SidleLauenstein, Tamani Durney, MD  cetirizine (ZYRTEC) 10 MG tablet Take 10 mg by mouth daily.    [provider]  ketotifen (ZADITOR) 0.025 % ophthalmic solution 1 drop 2 (two) times daily.    [provider]  levothyroxine (SYNTHROID) 112 MCG tablet Take 1 tablet (112 mcg total) by mouth daily. 03/15/19   Elvina SidleLauenstein, Chauntae Hults, MD  lisinopril (ZESTRIL) 10 MG tablet Take 1 tablet (10 mg total) by mouth daily. 03/15/19   Elvina SidleLauenstein, Marshayla Mitschke, MD    Family History Family History  Problem Relation Age of Onset  . Mental illness Mother   . Hypertension Father   . Hypertension Maternal  Grandmother   . Hypertension Paternal Grandmother   . Heart disease Paternal Grandfather     Social History Social History   Tobacco Use  . Smoking status: Never Smoker  . Smokeless tobacco: Never Used  Substance Use Topics  . Alcohol use: No    Alcohol/week: 0.0 standard drinks  . Drug use: No     Allergies   Patient has no known allergies.   Review of Systems Review of Systems  HENT: Positive for dental problem.   All other systems reviewed and are negative.    Physical Exam Triage Vital Signs ED Triage Vitals  Enc Vitals Group     BP 03/15/19 1648 (!) 156/99     Pulse Rate 03/15/19 1648 75     Resp 03/15/19 1648 16     Temp 03/15/19 1648 98.9 F (37.2 C)     Temp Source 03/15/19 1648 Oral     SpO2 03/15/19 1648 100 %     Weight 03/15/19 1649 144 lb (65.3 kg)     Height --      Head Circumference --      Peak Flow --      Pain Score 03/15/19 1649 5     Pain Loc --      Pain Edu? --      Excl. in GC? --  No data found.  Updated Vital Signs BP (!) 156/99 (BP Location: Right Arm)   Pulse 75   Temp 98.9 F (37.2 C) (Oral)   Resp 16   Wt 65.3 kg   SpO2 100%   BMI 23.96 kg/m    Physical Exam Vitals signs and nursing note reviewed.  Constitutional:      General: He is not in acute distress.    Appearance: Normal appearance. He is normal weight. He is not ill-appearing, toxic-appearing or diaphoretic.  HENT:     Head: Normocephalic.     Right Ear: External ear normal.     Left Ear: External ear normal.     Nose: Nose normal.     Mouth/Throat:     Mouth: Mucous membranes are moist.     Pharynx: Oropharynx is clear.     Comments: Horrible dentition Eyes:     Conjunctiva/sclera: Conjunctivae normal.     Pupils: Pupils are equal, round, and reactive to light.  Neck:     Musculoskeletal: Normal range of motion and neck supple.  Cardiovascular:     Rate and Rhythm: Normal rate and regular rhythm.     Heart sounds: Normal heart sounds.   Pulmonary:     Effort: Pulmonary effort is normal.     Breath sounds: Normal breath sounds.  Abdominal:     General: Abdomen is flat. There is no distension.     Palpations: There is no mass.     Tenderness: There is no abdominal tenderness.  Musculoskeletal: Normal range of motion.  Skin:    General: Skin is warm and dry.  Neurological:     General: No focal deficit present.     Mental Status: He is alert.  Psychiatric:        Mood and Affect: Mood normal.      UC Treatments / Results  Labs (all labs ordered are listed, but only abnormal results are displayed) Labs Reviewed - No data to display  EKG None  Radiology No results found.  Procedures Procedures (including critical care time)  Medications Ordered in UC Medications - No data to display  Initial Impression / Assessment and Plan / UC Course  I have reviewed the triage vital signs and the nursing notes.  Pertinent labs & imaging results that were available during my care of the patient were reviewed by me and considered in my medical decision making (see chart for details).    Final Clinical Impressions(s) / UC Diagnoses   Final diagnoses:  Essential hypertension  Hyperlipidemia, unspecified hyperlipidemia type  Acquired hypothyroidism  Dental caries  Medication refill   Discharge Instructions   None    ED Prescriptions    Medication Sig Dispense Auth. Provider   amLODipine (NORVASC) 10 MG tablet Take 1 tablet (10 mg total) by mouth daily. 90 tablet Robyn Haber, MD   atorvastatin (LIPITOR) 10 MG tablet Take 1 tablet (10 mg total) by mouth daily. 90 tablet Robyn Haber, MD   levothyroxine (SYNTHROID) 112 MCG tablet Take 1 tablet (112 mcg total) by mouth daily. 90 tablet Robyn Haber, MD   lisinopril (ZESTRIL) 10 MG tablet Take 1 tablet (10 mg total) by mouth daily. 90 tablet Robyn Haber, MD     Controlled Substance Prescriptions [place] Controlled Substance Registry consulted? Not  Applicable   Robyn Haber, MD 03/15/19 [id number]

## 2019-03-15 NOTE — ED Triage Notes (Signed)
Pt states he's out of his meds for 1 week or 2 .

## 2019-04-28 ENCOUNTER — Other Ambulatory Visit: Payer: Self-pay | Admitting: Physician Assistant

## 2019-05-16 ENCOUNTER — Other Ambulatory Visit: Payer: Self-pay | Admitting: Physician Assistant

## 2019-07-21 ENCOUNTER — Other Ambulatory Visit: Payer: Self-pay | Admitting: Family Medicine

## 2019-07-21 DIAGNOSIS — I10 Essential (primary) hypertension: Secondary | ICD-10-CM

## 2019-10-11 ENCOUNTER — Other Ambulatory Visit: Payer: Self-pay

## 2019-10-11 ENCOUNTER — Encounter (HOSPITAL_COMMUNITY): Payer: Self-pay

## 2019-10-11 ENCOUNTER — Ambulatory Visit (HOSPITAL_COMMUNITY)
Admission: EM | Admit: 2019-10-11 | Discharge: 2019-10-11 | Disposition: A | Discharge status: Home / Self Care | Payer: Self-pay | Attending: Family Medicine | Admitting: Family Medicine

## 2019-10-11 DIAGNOSIS — I10 Essential (primary) hypertension: Secondary | ICD-10-CM

## 2019-10-11 DIAGNOSIS — E782 Mixed hyperlipidemia: Secondary | ICD-10-CM

## 2019-10-11 MED ORDER — ATORVASTATIN CALCIUM 10 MG PO TABS: 10.0000 mg | ORAL_TABLET | Freq: Every day | ORAL | 2 refills | Status: DC

## 2019-10-11 MED ORDER — AMLODIPINE BESYLATE 10 MG PO TABS: 10.0000 mg | ORAL_TABLET | Freq: Every day | ORAL | 2 refills | Status: DC

## 2019-10-11 MED ORDER — LISINOPRIL 10 MG PO TABS: 10.0000 mg | ORAL_TABLET | Freq: Every day | ORAL | 2 refills | Status: DC

## 2019-10-11 NOTE — ED Triage Notes (Signed)
Patient presents to Urgent Care with complaints of needing his HTN and cholesterol medication refilled since running out yesterday. Patient reports he does not have a PCP, knows he needs.

## 2019-10-13 NOTE — ED Provider Notes (Signed)
[place]   [id number] 10/11/19 Arrival Time: 7829  ASSESSMENT & PLAN:  1. Mixed hyperlipidemia   2. Essential hypertension, benign   3. Essential hypertension   Stable.  Meds ordered this encounter  Medications  . amLODipine (NORVASC) 10 MG tablet    Sig: Take 1 tablet (10 mg total) by mouth daily.    Dispense:  30 tablet    Refill:  2  . atorvastatin (LIPITOR) 10 MG tablet    Sig: Take 1 tablet (10 mg total) by mouth daily.    Dispense:  30 tablet    Refill:  2    Please consider 90 day supplies to promote better adherence  . lisinopril (ZESTRIL) 10 MG tablet    Sig: Take 1 tablet (10 mg total) by mouth daily.    Dispense:  30 tablet    Refill:  2    Reviewed expectations re: course of current medical issues. Questions answered. Outlined signs and symptoms indicating need for more acute intervention. Patient verbalized understanding. After Visit Summary given.   SUBJECTIVE: History from: patient. James [name] is a 59 y.o. male who presents requesting medication refill. Reports stable medical history. "About to run out of my blood pressure and cholesterol medicine." Has not taken today. Feeling well.  Current medical problems include: Past Medical History:  Diagnosis Date  . Allergy   . Hypertension   . Substance abuse ([place])   . Thyroid disease    Current Outpatient Medications:  .  amLODipine (NORVASC) 10 MG tablet, Take 1 tablet (10 mg total) by mouth daily., Disp: 30 tablet, Rfl: 2 .  atorvastatin (LIPITOR) 10 MG tablet, Take 1 tablet (10 mg total) by mouth daily., Disp: 30 tablet, Rfl: 2 .  cetirizine (ZYRTEC) 10 MG tablet, Take 10 mg by mouth daily., Disp: , Rfl:  .  ketotifen (ZADITOR) 0.025 % ophthalmic solution, 1 drop 2 (two) times daily., Disp: , Rfl:  .  levothyroxine (SYNTHROID) 112 MCG tablet, Take 1 tablet (112 mcg total) by mouth daily., Disp: 90 tablet, Rfl: 3 .  lisinopril (ZESTRIL) 10 MG tablet, Take 1 tablet (10 mg total) by mouth  daily., Disp: 30 tablet, Rfl: 2  ROS: As per HPI. All other systems negative.    OBJECTIVE:  Vitals:   10/11/19 1806  BP: (!) 155/84  Pulse: 80  Resp: 16  Temp: 98.3 F (36.8 C)  TempSrc: Oral  SpO2: 100%    General appearance: alert; no distress Eyes: PERRLA; EOMI; conjunctiva normal HENT: normocephalic; atraumatic; TMs normal; nasal mucosa normal; oral mucosa normal Neck: sunlabored Heart: regular rate and rhythm Abdomen: soft, non-tender Back: no CVA tenderness Extremities: no cyanosis or edema; no gross deformities Skin: warm and dry Neurologic: normal gait; normal symmetric reflexes Psychological: alert and cooperative; normal mood and affect   No Known Allergies  Social History   Socioeconomic History  . Marital status: Single    Spouse name: Not on file  . Number of children: Not on file  . Years of education: Not on file  . Highest education level: Not on file  Occupational History  . Not on file  Tobacco Use  . Smoking status: Never Smoker  . Smokeless tobacco: Never Used  Substance and Sexual Activity  . Alcohol use: No    Alcohol/week: 0.0 standard drinks    Comment: history of... sober since 2000  . Drug use: No  . Sexual activity: Never    Birth control/protection: Abstinence  Other Topics Concern  .  Not on file  Social History Narrative  . Not on file   Social Determinants of Health   Financial Resource Strain:   . Difficulty of Paying Living Expenses: Not on file  Food Insecurity:   . Worried About Programme researcher, broadcasting/film/video in the Last Year: Not on file  . Ran Out of Food in the Last Year: Not on file  Transportation Needs:   . Lack of Transportation (Medical): Not on file  . Lack of Transportation (Non-Medical): Not on file  Physical Activity:   . Days of Exercise per Week: Not on file  . Minutes of Exercise per Session: Not on file  Stress:   . Feeling of Stress : Not on file  Social Connections:   . Frequency of Communication with  Friends and Family: Not on file  . Frequency of Social Gatherings with Friends and Family: Not on file  . Attends Religious Services: Not on file  . Active Member of Clubs or Organizations: Not on file  . Attends Banker Meetings: Not on file  . Marital Status: Not on file  Intimate Partner Violence:   . Fear of Current or Ex-Partner: Not on file  . Emotionally Abused: Not on file  . Physically Abused: Not on file  . Sexually Abused: Not on file   Family History  Problem Relation Age of Onset  . Mental illness Mother   . Hypertension Father   . Hypertension Maternal Grandmother   . Hypertension Paternal Grandmother   . Heart disease Paternal Grandfather    Past Surgical History:  Procedure Laterality Date  . FRACTURE SURGERY    . THYROID SURGERY  2006  . WISDOM TOOTH EXTRACTION       Mardella Layman, MD 10/13/19 1137

## 2020-01-27 ENCOUNTER — Other Ambulatory Visit: Payer: Self-pay

## 2020-01-27 ENCOUNTER — Ambulatory Visit (HOSPITAL_COMMUNITY)
Admission: EM | Admit: 2020-01-27 | Discharge: 2020-01-27 | Disposition: A | Discharge status: Home / Self Care | Payer: Self-pay | Attending: Family Medicine | Admitting: Family Medicine

## 2020-01-27 ENCOUNTER — Encounter (HOSPITAL_COMMUNITY): Payer: Self-pay

## 2020-01-27 DIAGNOSIS — I10 Essential (primary) hypertension: Secondary | ICD-10-CM

## 2020-01-27 DIAGNOSIS — E782 Mixed hyperlipidemia: Secondary | ICD-10-CM

## 2020-01-27 DIAGNOSIS — E785 Hyperlipidemia, unspecified: Secondary | ICD-10-CM

## 2020-01-27 MED ORDER — AMLODIPINE BESYLATE 10 MG PO TABS: 10.0000 mg | ORAL_TABLET | Freq: Every day | ORAL | 2 refills | Status: DC

## 2020-01-27 MED ORDER — ATORVASTATIN CALCIUM 10 MG PO TABS: 10.0000 mg | ORAL_TABLET | Freq: Every day | ORAL | 2 refills | Status: DC

## 2020-01-27 MED ORDER — LISINOPRIL 10 MG PO TABS: 10.0000 mg | ORAL_TABLET | Freq: Every day | ORAL | 2 refills | Status: DC

## 2020-01-27 NOTE — ED Triage Notes (Signed)
Pt presents for medication refill on blood pressure medication. 

## 2020-01-30 NOTE — ED Provider Notes (Signed)
[place]   [id number] 01/27/20 Arrival Time: 2202  ASSESSMENT & PLAN:  1. Hyperlipidemia, unspecified hyperlipidemia type   2. Essential hypertension   3. Mixed hyperlipidemia     Meds ordered this encounter  Medications  . amLODipine (NORVASC) 10 MG tablet    Sig: Take 1 tablet (10 mg total) by mouth daily.    Dispense:  30 tablet    Refill:  2  . lisinopril (ZESTRIL) 10 MG tablet    Sig: Take 1 tablet (10 mg total) by mouth daily.    Dispense:  30 tablet    Refill:  2  . atorvastatin (LIPITOR) 10 MG tablet    Sig: Take 1 tablet (10 mg total) by mouth daily.    Dispense:  30 tablet    Refill:  2    Please consider 90 day supplies to promote better adherence    Reviewed expectations re: course of current medical issues. Questions answered. Outlined signs and symptoms indicating need for more acute intervention. Patient verbalized understanding. After Visit Summary given.   SUBJECTIVE: History from: patient. James [name] is a 59 y.o. male who presents requesting medication refill. Reports stable medical history. "About to run out of my blood pressure and cholesterol medicine." Has not taken today. Feeling well.  Current medical problems include: Past Medical History:  Diagnosis Date  . Allergy   . Hypertension   . Substance abuse ([place])   . Thyroid disease       Current Outpatient Medications (Endocrine & Metabolic):  .  levothyroxine (SYNTHROID) 112 MCG tablet, Take 1 tablet (112 mcg total) by mouth daily.   Current Outpatient Medications (Cardiovascular):  .  amLODipine (NORVASC) 10 MG tablet, Take 1 tablet (10 mg total) by mouth daily. Marland Kitchen  atorvastatin (LIPITOR) 10 MG tablet, Take 1 tablet (10 mg total) by mouth daily. Marland Kitchen  lisinopril (ZESTRIL) 10 MG tablet, Take 1 tablet (10 mg total) by mouth daily.   Current Outpatient Medications (Respiratory):  .  cetirizine (ZYRTEC) 10 MG tablet, Take 10 mg by mouth daily.       Current  Outpatient Medications (Other):  .  ketotifen (ZADITOR) 0.025 % ophthalmic solution, 1 drop 2 (two) times daily. No current facility-administered medications for this encounter.   OBJECTIVE:  Vitals:   01/27/20 1538  BP: (!) 174/93  Pulse: 66  Resp: 18  Temp: (!) 97.5 F (36.4 C)  TempSrc: Oral  SpO2: 100%    General appearance: alert; no distress Eyes: PERRLA; EOMI; conjunctiva normal Lungs: unlabored Heart: regular Extremities: no edema; symmetrical with no gross deformities Skin: warm and dry Neurologic: normal gait; normal symmetric reflexes Psychological: alert and cooperative; normal mood and affect   No Known Allergies  Social History   Socioeconomic History  . Marital status: Single    Spouse name: Not on file  . Number of children: Not on file  . Years of education: Not on file  . Highest education level: Not on file  Occupational History  . Not on file  Tobacco Use  . Smoking status: Never Smoker  . Smokeless tobacco: Never Used  Substance and Sexual Activity  . Alcohol use: No    Alcohol/week: 0.0 standard drinks    Comment: history of... sober since 2000  . Drug use: No  . Sexual activity: Never    Birth control/protection: Abstinence  Other Topics Concern  . Not on file  Social History Narrative  . Not on file   Social Determinants of Health  Financial Resource Strain:   . Difficulty of Paying Living Expenses:   Food Insecurity:   . Worried About Programme researcher, broadcasting/film/video in the Last Year:   . Barista in the Last Year:   Transportation Needs:   . Freight forwarder (Medical):   Marland Kitchen Lack of Transportation (Non-Medical):   Physical Activity:   . Days of Exercise per Week:   . Minutes of Exercise per Session:   Stress:   . Feeling of Stress :   Social Connections:   . Frequency of Communication with Friends and Family:   . Frequency of Social Gatherings with Friends and Family:   . Attends Religious Services:   . Active Member of  Clubs or Organizations:   . Attends Banker Meetings:   Marland Kitchen Marital Status:   Intimate Partner Violence:   . Fear of Current or Ex-Partner:   . Emotionally Abused:   Marland Kitchen Physically Abused:   . Sexually Abused:    Family History  Problem Relation Age of Onset  . Mental illness Mother   . Hypertension Father   . Hypertension Maternal Grandmother   . Hypertension Paternal Grandmother   . Heart disease Paternal Grandfather    Past Surgical History:  Procedure Laterality Date  . FRACTURE SURGERY    . THYROID SURGERY  2006  . WISDOM TOOTH EXTRACTION       Mardella Layman, MD 01/30/20 [id number]

## 2021-07-11 ENCOUNTER — Other Ambulatory Visit: Payer: Self-pay

## 2021-07-11 ENCOUNTER — Ambulatory Visit (HOSPITAL_COMMUNITY)
Admission: EM | Admit: 2021-07-11 | Discharge: 2021-07-11 | Disposition: A | Discharge status: Home / Self Care | Payer: Self-pay | Attending: Emergency Medicine | Admitting: Emergency Medicine

## 2021-07-11 ENCOUNTER — Encounter (HOSPITAL_COMMUNITY): Payer: Self-pay | Admitting: Emergency Medicine

## 2021-07-11 DIAGNOSIS — E782 Mixed hyperlipidemia: Secondary | ICD-10-CM

## 2021-07-11 DIAGNOSIS — I10 Essential (primary) hypertension: Secondary | ICD-10-CM

## 2021-07-11 DIAGNOSIS — E039 Hypothyroidism, unspecified: Secondary | ICD-10-CM

## 2021-07-11 DIAGNOSIS — Z760 Encounter for issue of repeat prescription: Secondary | ICD-10-CM

## 2021-07-11 MED ORDER — LISINOPRIL 10 MG PO TABS: 10.0000 mg | ORAL_TABLET | Freq: Every day | ORAL | 1 refills | Status: DC

## 2021-07-11 MED ORDER — LEVOTHYROXINE SODIUM 112 MCG PO TABS: 112.0000 ug | ORAL_TABLET | Freq: Every day | ORAL | 1 refills | Status: DC

## 2021-07-11 MED ORDER — AMLODIPINE BESYLATE 10 MG PO TABS: 10.0000 mg | ORAL_TABLET | Freq: Every day | ORAL | 1 refills | Status: DC

## 2021-07-11 NOTE — ED Provider Notes (Signed)
[facility]    CSN: [id number] Arrival date & time: 07/11/21  1614      History   Chief Complaint Chief Complaint  Patient presents with   Medication Refill    HPI James [name] is a 60 y.o. male.   Patient presents requesting medication refill for amlodipine, lisinopril and levothyroxine.  Endorses that he has been taking medication daily as prescribed.  Endorses that he does not have a primary care provider and has been getting medication refilled at [facility] urgent cares.  Denies any symptoms currently and endorses that medication has been very effective in managing his chronic illnesses.  Past Medical History:  Diagnosis Date   Allergy    Hypertension    Substance abuse (HCC)    Thyroid disease     Patient Active Problem List   Diagnosis Date Noted   Mixed hyperlipidemia 09/24/2015   Hypothyroidism 12/23/2012   Essential hypertension, benign 12/23/2012   Other and unspecified hyperlipidemia 12/23/2012   Encounter for long-term (current) use of other medications 12/23/2012    Past Surgical History:  Procedure Laterality Date   FRACTURE SURGERY     THYROID SURGERY  2006   WISDOM TOOTH EXTRACTION         Home Medications    Prior to Admission medications   Medication Sig Start Date End Date Taking? Authorizing Provider  amLODipine (NORVASC) 10 MG tablet Take 1 tablet (10 mg total) by mouth daily. 07/11/21   Verlinda Slotnick, Elita Boone, NP  atorvastatin (LIPITOR) 10 MG tablet Take 1 tablet (10 mg total) by mouth daily. 01/27/20   Mardella Layman, MD  cetirizine (ZYRTEC) 10 MG tablet Take 10 mg by mouth daily.    [provider]  ketotifen (ZADITOR) 0.025 % ophthalmic solution 1 drop 2 (two) times daily.    [provider]  levothyroxine (SYNTHROID) 112 MCG tablet Take 1 tablet (112 mcg total) by mouth daily. 07/11/21   Sable Knoles, Elita Boone, NP  lisinopril (ZESTRIL) 10 MG tablet Take 1 tablet (10 mg total) by mouth daily. 07/11/21   Valinda Hoar,  NP    Family History Family History  Problem Relation Age of Onset   Mental illness Mother    Hypertension Father    Hypertension Maternal Grandmother    Hypertension Paternal Grandmother    Heart disease Paternal Grandfather     Social History Social History   Tobacco Use   Smoking status: Never   Smokeless tobacco: Never  Vaping Use   Vaping Use: Never used  Substance Use Topics   Alcohol use: No    Alcohol/week: 0.0 standard drinks    Comment: history of... sober since 2000   Drug use: No     Allergies   Patient has no known allergies.   Review of Systems Review of Systems  Constitutional: Negative.   Respiratory: Negative.    Cardiovascular: Negative.   Gastrointestinal: Negative.   Genitourinary: Negative.   Skin: Negative.   Neurological: Negative.     Physical Exam Triage Vital Signs ED Triage Vitals  Enc Vitals Group     BP 07/11/21 1824 (!) 177/84     Pulse Rate 07/11/21 1824 66     Resp 07/11/21 1824 17     Temp 07/11/21 1824 98.5 F (36.9 C)     Temp Source 07/11/21 1824 Oral     SpO2 07/11/21 1824 99 %     Weight --      Height --  Head Circumference --      Peak Flow --      Pain Score 07/11/21 1822 0     Pain Loc --      Pain Edu? --      Excl. in GC? --    No data found.  Updated Vital Signs BP (!) 177/84 (BP Location: Right Arm) Comment: hasnt taken BP meds in couple days  Pulse 66   Temp 98.5 F (36.9 C) (Oral)   Resp 17   SpO2 99%   Visual Acuity Right Eye Distance:   Left Eye Distance:   Bilateral Distance:    Right Eye Near:   Left Eye Near:    Bilateral Near:     Physical Exam Constitutional:      Appearance: Normal appearance. He is normal weight.  Eyes:     Extraocular Movements: Extraocular movements intact.  Cardiovascular:     Rate and Rhythm: Normal rate and regular rhythm.     Pulses: Normal pulses.     Heart sounds: Normal heart sounds.  Pulmonary:     Effort: Pulmonary effort is normal.      Breath sounds: Normal breath sounds.  Skin:    General: Skin is warm and dry.  Neurological:     Mental Status: He is alert and oriented to person, place, and time. Mental status is at baseline.  Psychiatric:        Mood and Affect: Mood normal.        Behavior: Behavior normal.     UC Treatments / Results  Labs (all labs ordered are listed, but only abnormal results are displayed) Labs Reviewed - No data to display  EKG   Radiology No results found.  Procedures Procedures (including critical care time)  Medications Ordered in UC Medications - No data to display  Initial Impression / Assessment and Plan / UC Course  I have reviewed the triage vital signs and the nursing notes.  Pertinent labs & imaging results that were available during my care of the patient were reviewed by me and considered in my medical decision making (see chart for details).  Encounter for medication refill Mixed hyperlipidemia Hypothyroidism Essential hypertension  1.  Refill given for amlodipine, lisinopril and levothyroxine, confirmed doses with the medication bottle 2.  Discussed with patient that he was going to the [facility] health and wellness center for primary care, given information and advised to call and set appointment for further evaluation and annual exam 3.  Blood pressure elevated in triage, patient has not taken medication today due to concerns about fentanyl, unable to give clear reason why ,denies drug use, history of substance abuse, no signs of hypertensive urgency, given return precautions to go to the nearest emergency department for signs and symptoms of hypertensive emergency Final Clinical Impressions(s) / UC Diagnoses   Final diagnoses:  Encounter for medication refill  Mixed hyperlipidemia  Hypothyroidism, unspecified type  Essential hypertension     Discharge Instructions      Continue use of medications as prescribed  Please call the community health and wellness  center to make an appointment for further evaluation of your cholesterol, thyroid and blood pressure  It is time for you to get blood work to make sure that your medications are still appropriate     ED Prescriptions     Medication Sig Dispense Auth. Provider   amLODipine (NORVASC) 10 MG tablet Take 1 tablet (10 mg total) by mouth daily. 30 tablet [place], [place],  NP   levothyroxine (SYNTHROID) 112 MCG tablet Take 1 tablet (112 mcg total) by mouth daily. 30 tablet Ahley Bulls R, NP   lisinopril (ZESTRIL) 10 MG tablet Take 1 tablet (10 mg total) by mouth daily. 30 tablet Valinda Hoar, NP      PDMP not reviewed this encounter.   Valinda Hoar, NP 07/11/21 1900

## 2021-07-11 NOTE — Discharge Instructions (Addendum)
Continue use of medications as prescribed  Please call the community health and wellness center to make an appointment for further evaluation of your cholesterol, thyroid and blood pressure  It is time for you to get blood work to make sure that your medications are still appropriate

## 2021-07-11 NOTE — ED Triage Notes (Signed)
Needs refill on amlodipine 10mg  daily Lisinopril 20mg  daily Levothyroxine daily.  Pt hasnt taken medications today due to "being scared of the fentanyl".

## 2022-05-04 ENCOUNTER — Other Ambulatory Visit (HOSPITAL_COMMUNITY): Payer: Self-pay

## 2022-05-04 MED ORDER — LISINOPRIL 10 MG PO TABS
10.0000 mg | ORAL_TABLET | Freq: Every day | ORAL | 1 refills | Status: DC
  Filled 2022-05-04: qty 60, 60d supply, fill #0

## 2022-05-04 MED ORDER — ROSUVASTATIN CALCIUM 10 MG PO TABS
10.0000 mg | ORAL_TABLET | Freq: Every day | ORAL | 1 refills | Status: DC
  Filled 2022-05-04: qty 60, 60d supply, fill #0

## 2022-05-04 MED ORDER — AMLODIPINE BESYLATE 10 MG PO TABS
10.0000 mg | ORAL_TABLET | Freq: Every day | ORAL | 1 refills | Status: DC
  Filled 2022-05-04: qty 60, 60d supply, fill #0

## 2022-05-04 MED ORDER — LEVOTHYROXINE SODIUM 112 MCG PO TABS
112.0000 ug | ORAL_TABLET | Freq: Every day | ORAL | 5 refills | Status: DC
  Filled 2022-05-04: qty 30, 30d supply, fill #0

## 2022-06-08 ENCOUNTER — Other Ambulatory Visit (HOSPITAL_COMMUNITY): Payer: Self-pay

## 2023-07-17 ENCOUNTER — Encounter (HOSPITAL_COMMUNITY): Payer: Self-pay

## 2023-07-17 ENCOUNTER — Ambulatory Visit (HOSPITAL_COMMUNITY)
Admission: EM | Admit: 2023-07-17 | Discharge: 2023-07-17 | Disposition: A | Discharge status: Home / Self Care | Payer: Medicaid Other | Attending: Emergency Medicine | Admitting: Emergency Medicine

## 2023-07-17 DIAGNOSIS — E782 Mixed hyperlipidemia: Secondary | ICD-10-CM

## 2023-07-17 DIAGNOSIS — Z760 Encounter for issue of repeat prescription: Secondary | ICD-10-CM

## 2023-07-17 DIAGNOSIS — I10 Essential (primary) hypertension: Secondary | ICD-10-CM

## 2023-07-17 MED ORDER — LISINOPRIL 10 MG PO TABS: 10.0000 mg | ORAL_TABLET | Freq: Every day | ORAL | 1 refills | Status: DC

## 2023-07-17 MED ORDER — LEVOTHYROXINE SODIUM 112 MCG PO TABS: 112.0000 ug | ORAL_TABLET | Freq: Every day | ORAL | 1 refills | Status: DC

## 2023-07-17 MED ORDER — ATORVASTATIN CALCIUM 10 MG PO TABS: 10.0000 mg | ORAL_TABLET | Freq: Every day | ORAL | 1 refills | Status: DC

## 2023-07-17 NOTE — ED Triage Notes (Signed)
Pt states he hasn't been taking his BP medication.  States he hasn't been taking all of his BP medications.

## 2023-07-17 NOTE — Discharge Instructions (Signed)
I have refilled your lisinopril, levothyroxine, and atorvastatin and given you 1 additional refill.  Please follow-up with primary care provider that clinical staff set you up with today regarding further management of blood pressure, cholesterol, and hypothyroidism.  Return here as needed.

## 2023-07-17 NOTE — ED Provider Notes (Signed)
[facility]    CSN: [id number] Arrival date & time: 07/17/23  1753      History   Chief Complaint Chief Complaint  Patient presents with   Hypertension    HPI James [name] is a 62 y.o. male.   Patient presents with high blood pressure.  Patient states he went to eye doctor today and his blood pressure was high and they told him to come here to be evaluated.  Patient reports taking amlodipine today.  Patient reports he has run out of lisinopril, levothyroxine, and atorvastatin and is requesting refills.  Denies any symptoms.   Hypertension    Past Medical History:  Diagnosis Date   Allergy    Hypertension    Substance abuse (HCC)    Thyroid disease     Patient Active Problem List   Diagnosis Date Noted   Mixed hyperlipidemia 09/24/2015   Hypothyroidism 12/23/2012   Essential hypertension, benign 12/23/2012   Other and unspecified hyperlipidemia 12/23/2012   Encounter for long-term (current) use of other medications 12/23/2012    Past Surgical History:  Procedure Laterality Date   FRACTURE SURGERY     THYROID SURGERY  2006   WISDOM TOOTH EXTRACTION         Home Medications    Prior to Admission medications   Medication Sig Start Date End Date Taking? Authorizing Provider  amLODipine (NORVASC) 10 MG tablet Take 1 tablet (10 mg total) by mouth daily. 07/11/21   White, Elita Boone, NP  amLODipine (NORVASC) 10 MG tablet Take 1 tablet (10 mg total) by mouth daily. 12/19/21     atorvastatin (LIPITOR) 10 MG tablet Take 1 tablet (10 mg total) by mouth daily. 07/17/23   Wynonia Lawman A, NP  cetirizine (ZYRTEC) 10 MG tablet Take 10 mg by mouth daily.    [provider]  ketotifen (ZADITOR) 0.025 % ophthalmic solution 1 drop 2 (two) times daily.    [provider]  levothyroxine (SYNTHROID) 112 MCG tablet Take 1 tablet (112 mcg total) by mouth daily. 12/19/21     levothyroxine (SYNTHROID) 112 MCG tablet Take 1 tablet (112 mcg total) by  mouth daily. 07/17/23   Wynonia Lawman A, NP  lisinopril (ZESTRIL) 10 MG tablet Take 1 tablet (10 mg total) by mouth daily. 07/17/23   Letta Kocher, NP    Family History Family History  Problem Relation Age of Onset   Mental illness Mother    Hypertension Father    Hypertension Maternal Grandmother    Hypertension Paternal Grandmother    Heart disease Paternal Grandfather     Social History Social History   Tobacco Use   Smoking status: Never   Smokeless tobacco: Never  Vaping Use   Vaping status: Never Used  Substance Use Topics   Alcohol use: No    Alcohol/week: 0.0 standard drinks of alcohol    Comment: history of... sober since 2000   Drug use: No     Allergies   Patient has no known allergies.   Review of Systems Review of Systems  All other systems reviewed and are negative.    Physical Exam Triage Vital Signs ED Triage Vitals  Encounter Vitals Group     BP 07/17/23 1807 (!) 162/72     Systolic BP Percentile --      Diastolic BP Percentile --      Pulse Rate 07/17/23 1807 68     Resp 07/17/23 1807 16     Temp 07/17/23 1807 98  F (36.7 C)     Temp Source 07/17/23 1807 Oral     SpO2 07/17/23 1807 98 %     Weight --      Height --      Head Circumference --      Peak Flow --      Pain Score 07/17/23 1808 0     Pain Loc --      Pain Education --      Exclude from Growth Chart --    No data found.  Updated Vital Signs BP (!) 162/72 (BP Location: Left Arm)   Pulse 68   Temp 98 F (36.7 C) (Oral)   Resp 16   SpO2 98%   Visual Acuity Right Eye Distance:   Left Eye Distance:   Bilateral Distance:    Right Eye Near:   Left Eye Near:    Bilateral Near:     Physical Exam Vitals and nursing note reviewed.  Constitutional:      General: He is not in acute distress.    Appearance: Normal appearance. He is not ill-appearing, toxic-appearing or diaphoretic.  Neurological:     Mental Status: He is alert and oriented to person, place,  and time.     GCS: GCS eye subscore is 4. GCS verbal subscore is 5. GCS motor subscore is 6.      UC Treatments / Results  Labs (all labs ordered are listed, but only abnormal results are displayed) Labs Reviewed - No data to display  EKG   Radiology No results found.  Procedures Procedures (including critical care time)  Medications Ordered in UC Medications - No data to display  Initial Impression / Assessment and Plan / UC Course  I have reviewed the triage vital signs and the nursing notes.  Pertinent labs & imaging results that were available during my care of the patient were reviewed by me and considered in my medical decision making (see chart for details).     Patient presented with high blood pressure.  Patient reports taking amlodipine.  Patient states he ran out of his lisinopril, levothyroxine, and atorvastatin and is requesting printed prescriptions.  No significant findings upon assessment.  GCS 15.  Refilled and given printed prescriptions.  Asked clinical staff to set up PCP appointment prior to leaving clinic.  Discussed follow-up and return precautions. Final Clinical Impressions(s) / UC Diagnoses   Final diagnoses:  Encounter for medication refill     Discharge Instructions      I have refilled your lisinopril, levothyroxine, and atorvastatin and given you 1 additional refill.  Please follow-up with primary care provider that clinical staff set you up with today regarding further management of blood pressure, cholesterol, and hypothyroidism.  Return here as needed.    ED Prescriptions     Medication Sig Dispense Auth. Provider   atorvastatin (LIPITOR) 10 MG tablet Take 1 tablet (10 mg total) by mouth daily. 30 tablet Wynonia Lawman A, NP   levothyroxine (SYNTHROID) 112 MCG tablet Take 1 tablet (112 mcg total) by mouth daily. 30 tablet Susann Givens, Emory Leaver A, NP   lisinopril (ZESTRIL) 10 MG tablet Take 1 tablet (10 mg total) by mouth daily. 30 tablet  Wynonia Lawman A, NP      PDMP not reviewed this encounter.   Wynonia Lawman A, NP 07/17/23 [phone]

## 2023-07-17 NOTE — ED Notes (Signed)
Pt set up for PCP 07/30/23 pt given the information and verbalized understanding.

## 2023-07-30 ENCOUNTER — Encounter (HOSPITAL_BASED_OUTPATIENT_CLINIC_OR_DEPARTMENT_OTHER): Payer: Self-pay | Admitting: Family Medicine

## 2023-07-30 ENCOUNTER — Ambulatory Visit (INDEPENDENT_AMBULATORY_CARE_PROVIDER_SITE_OTHER): Payer: Medicaid Other | Admitting: Family Medicine

## 2023-07-30 VITALS — BP 140/83 | HR 64 | Ht 65.0 in | Wt 135.3 lb

## 2023-07-30 DIAGNOSIS — E782 Mixed hyperlipidemia: Secondary | ICD-10-CM | POA: Diagnosis not present

## 2023-07-30 DIAGNOSIS — I10 Essential (primary) hypertension: Secondary | ICD-10-CM | POA: Diagnosis not present

## 2023-07-30 DIAGNOSIS — E039 Hypothyroidism, unspecified: Secondary | ICD-10-CM | POA: Diagnosis not present

## 2023-07-30 MED ORDER — LISINOPRIL 10 MG PO TABS: 10.0000 mg | ORAL_TABLET | Freq: Every day | ORAL | 1 refills | Status: DC

## 2023-07-30 MED ORDER — AMLODIPINE BESYLATE 10 MG PO TABS: 10.0000 mg | ORAL_TABLET | Freq: Every day | ORAL | 1 refills | Status: DC

## 2023-07-30 MED ORDER — LEVOTHYROXINE SODIUM 112 MCG PO TABS: 112.0000 ug | ORAL_TABLET | Freq: Every day | ORAL | 1 refills | Status: DC

## 2023-07-30 MED ORDER — ATORVASTATIN CALCIUM 10 MG PO TABS: 10.0000 mg | ORAL_TABLET | Freq: Every day | ORAL | 1 refills | Status: DC

## 2023-07-30 NOTE — Assessment & Plan Note (Signed)
Tolerating atorvastatin, can continue with medication.  We will plan to recheck cholesterol panel in about 2 months after having been on statin therapy consistently

## 2023-07-30 NOTE — Progress Notes (Signed)
New Patient Office Visit  Subjective    Patient ID: James [name], male    DOB: 1961/08/24  Age: 62 y.o. MRN: [id number]  CC:  Chief Complaint  Patient presents with   New Patient (Initial Visit)    Patient is here today to get established with the practice.     HPI James [name] presents to establish care Last PCP - was a doctor who came into shelter, thinks had one before then, cannot recall  HTN: Diagnosed in the past, has had various refills through urgent care or doctor that will come to shelter.  Reports that he currently has been taking amlodipine as well as lisinopril.  Hydrochlorothiazide is on list of medications, however is not taking this currently.  Does not check blood pressure regularly at home.  Denies any issues with chest pain or headaches.  Hypothyroidism: Currently has been taking levothyroxine 112 mcg daily.  He had been without this medication for several months, has only been taking now for about 1 week.  Denies any current symptoms such as temperature intolerance, weight changes, changes in bowel habits.  HLD: Has been prescribed atorvastatin in the past, was without medication for a while, did resume taking within the past week or so.  Denies any side effects from medication.  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  Patient is originally from [place], moved here in 1969.  Patient currently is homeless, lives in shelter.  He also has difficulty regards to transportation, did ride bicycle here to office today.  Outpatient Encounter Medications as of 07/30/2023  Medication Sig   cetirizine (ZYRTEC) 10 MG tablet Take 10 mg by mouth daily.   ketotifen (ZADITOR) 0.025 % ophthalmic solution 1 drop 2 (two) times daily.   [DISCONTINUED] amLODipine (NORVASC) 10 MG tablet Take 1 tablet (10 mg total) by mouth daily.   [DISCONTINUED] amLODipine (NORVASC) 10 MG tablet Take 1 tablet by mouth daily.   [DISCONTINUED]  atorvastatin (LIPITOR) 10 MG tablet Take 1 tablet (10 mg total) by mouth daily.   [DISCONTINUED] hydrochlorothiazide (MICROZIDE) 12.5 MG capsule Take 12.5 mg by mouth every morning.   [DISCONTINUED] levothyroxine (SYNTHROID) 112 MCG tablet Take 1 tablet (112 mcg total) by mouth daily.   [DISCONTINUED] lisinopril (ZESTRIL) 10 MG tablet Take 1 tablet (10 mg total) by mouth daily.   amLODipine (NORVASC) 10 MG tablet Take 1 tablet (10 mg total) by mouth daily.   atorvastatin (LIPITOR) 10 MG tablet Take 1 tablet (10 mg total) by mouth daily.   levothyroxine (SYNTHROID) 112 MCG tablet Take 1 tablet (112 mcg total) by mouth daily.   lisinopril (ZESTRIL) 10 MG tablet Take 1 tablet (10 mg total) by mouth daily.   [DISCONTINUED] amLODipine (NORVASC) 10 MG tablet Take 1 tablet (10 mg total) by mouth daily.   [DISCONTINUED] levothyroxine (SYNTHROID) 112 MCG tablet Take 1 tablet (112 mcg total) by mouth daily.   No facility-administered encounter medications on file as of 07/30/2023.    Past Medical History:  Diagnosis Date   Allergy    Hypertension    Substance abuse (HCC)    Thyroid disease     Past Surgical History:  Procedure Laterality Date   FRACTURE SURGERY     THYROID SURGERY  2006   WISDOM TOOTH EXTRACTION      Family History  Problem Relation Age of Onset   Mental illness Mother    Hypertension Father    Hypertension Maternal  Grandmother    Hypertension Paternal Grandmother    Heart disease Paternal Grandfather     Social History   Socioeconomic History   Marital status: Single    Spouse name: Not on file   Number of children: Not on file   Years of education: Not on file   Highest education level: Not on file  Occupational History   Not on file  Tobacco Use   Smoking status: Never   Smokeless tobacco: Never  Vaping Use   Vaping status: Never Used  Substance and Sexual Activity   Alcohol use: No    Alcohol/week: 0.0 standard drinks of alcohol    Comment: history  of... sober since 2000   Drug use: No   Sexual activity: Never    Birth control/protection: Abstinence  Other Topics Concern   Not on file  Social History Narrative   Not on file   Social Determinants of Health   Financial Resource Strain: High Risk (07/30/2023)   Overall Financial Resource Strain (CARDIA)    Difficulty of Paying Living Expenses: Very hard  Food Insecurity: Food Insecurity Present (07/30/2023)   Hunger Vital Sign    Worried About Running Out of Food in the Last Year: Often true    Ran Out of Food in the Last Year: Often true  Transportation Needs: Unmet Transportation Needs (07/30/2023)   PRAPARE - Administrator, Civil Service (Medical): Yes    Lack of Transportation (Non-Medical): Yes  Physical Activity: Sufficiently Active (07/30/2023)   Exercise Vital Sign    Days of Exercise per Week: 5 days    Minutes of Exercise per Session: 100 min  Stress: Stress Concern Present (07/30/2023)   [employer] of Occupational Health - Occupational Stress Questionnaire    Feeling of Stress : To some extent  Social Connections: Socially Isolated (07/30/2023)   Social Connection and Isolation Panel [NHANES]    Frequency of Communication with Friends and Family: Never    Frequency of Social Gatherings with Friends and Family: Never    Attends Religious Services: Never    Database administrator or Organizations: No    Attends Banker Meetings: Never    Marital Status: Never married  Intimate Partner Violence: Not At Risk (07/30/2023)   Humiliation, Afraid, Rape, and Kick questionnaire    Fear of Current or Ex-Partner: No    Emotionally Abused: No    Physically Abused: No    Sexually Abused: No    Objective    BP (!) 140/83   Pulse 64   Ht 5\' 5"  (1.651 m)   Wt 135 lb 4.8 oz (61.4 kg)   SpO2 100%   BMI 22.52 kg/m   Physical Exam  62 year old male in no acute distress Cardiovascular exam with regular rate and rhythm, no murmur  appreciated Lungs clear to auscultation bilaterally  Assessment & Plan:   Essential hypertension, benign Assessment & Plan: Blood pressure borderline elevated in office today.  Can continue with current medication regimen.  Recommend intermittent monitoring of blood pressure as able.  Recommend DASH diet.  We will continue to monitor closely and determine need for any changes and medication.   Hypothyroidism, unspecified type Assessment & Plan: Patient only recently resumed levothyroxine.  Will allow for continuation with medication for at least 6 weeks in order to achieve normal state for assessment of TSH  Orders: -     TSH; Future  Mixed hyperlipidemia Assessment & Plan: Tolerating atorvastatin, can continue with medication.  We will plan to recheck cholesterol panel in about 2 months after having been on statin therapy consistently  Orders: -     Lipid panel; Future -     Atorvastatin Calcium; Take 1 tablet (10 mg total) by mouth daily.  Dispense: 90 tablet; Refill: 1  Essential hypertension -     amLODIPine Besylate; Take 1 tablet (10 mg total) by mouth daily.  Dispense: 90 tablet; Refill: 1 -     Lisinopril; Take 1 tablet (10 mg total) by mouth daily.  Dispense: 90 tablet; Refill: 1  Other orders -     Levothyroxine Sodium; Take 1 tablet (112 mcg total) by mouth daily.  Dispense: 30 tablet; Refill: 1  Longer duration of medication sent to pharmacy on file so that patient will be less likely to have any lapses in medication ministration  Return in about 2 months (around 09/29/2023) for hypothyroidism, hyperlipidemia.   Spent 47 minutes on this patient encounter, including preparation, chart review, face-to-face counseling with patient and coordination of care, and documentation of encounter   ___________________________________________ Madellyn Denio de [place], MD, ABFM, [facility] Primary Care and Sports Medicine [facility]

## 2023-07-30 NOTE — Assessment & Plan Note (Signed)
Blood pressure borderline elevated in office today.  Can continue with current medication regimen.  Recommend intermittent monitoring of blood pressure as able.  Recommend DASH diet.  We will continue to monitor closely and determine need for any changes and medication.

## 2023-07-30 NOTE — Assessment & Plan Note (Signed)
Patient only recently resumed levothyroxine.  Will allow for continuation with medication for at least 6 weeks in order to achieve normal state for assessment of TSH

## 2023-07-30 NOTE — Patient Instructions (Signed)
  Medication Instructions:  Your physician recommends that you continue on your current medications as directed. Please refer to the Current Medication list given to you today. --If you need a refill on any your medications before your next appointment, please call your pharmacy first. If no refills are authorized on file call the office.-- Lab Work: Your physician has recommended that you have lab work today: no If you have labs (blood work) drawn today and your tests are completely normal, you will receive your results via MyChart message OR a phone call from our staff.  Please ensure you check your voicemail in the event that you authorized detailed messages to be left on a delegated number. If you have any lab test that is abnormal or we need to change your treatment, we will call you to review the results.  Referrals/Procedures/Imaging: no  Follow-Up: Your next appointment:   Your physician recommends that you schedule a follow-up appointment in: 3 months with Dr. de Peru  You will receive a text message or e-mail with a link to a survey about your care and experience with [place] today! We would greatly appreciate your feedback!   Thanks for letting us be apart of your health journey!!  Primary Care and Sports Medicine   Dr. Ceasar Mons Peru   We encourage you to activate your patient portal called "MyChart".  Sign up information is provided on this After Visit Summary.  MyChart is used to connect with patients for Virtual Visits (Telemedicine).  Patients are able to view lab/test results, encounter notes, upcoming appointments, etc.  Non-urgent messages can be sent to your provider as well. To learn more about what you can do with MyChart, please visit --  [email or web address].

## 2023-09-28 ENCOUNTER — Other Ambulatory Visit (HOSPITAL_BASED_OUTPATIENT_CLINIC_OR_DEPARTMENT_OTHER): Payer: Self-pay

## 2023-09-28 ENCOUNTER — Other Ambulatory Visit (HOSPITAL_BASED_OUTPATIENT_CLINIC_OR_DEPARTMENT_OTHER): Payer: Self-pay | Admitting: *Deleted

## 2023-09-28 DIAGNOSIS — I10 Essential (primary) hypertension: Secondary | ICD-10-CM

## 2023-09-28 MED ORDER — LISINOPRIL 10 MG PO TABS
10.0000 mg | ORAL_TABLET | Freq: Every day | ORAL | 1 refills | Status: DC
  Filled 2023-09-28 (×2): qty 90, 90d supply, fill #0

## 2023-10-01 ENCOUNTER — Ambulatory Visit (HOSPITAL_BASED_OUTPATIENT_CLINIC_OR_DEPARTMENT_OTHER): Payer: Medicaid Other | Admitting: Family Medicine

## 2023-10-08 ENCOUNTER — Other Ambulatory Visit (HOSPITAL_BASED_OUTPATIENT_CLINIC_OR_DEPARTMENT_OTHER): Payer: Self-pay

## 2023-10-08 DIAGNOSIS — E782 Mixed hyperlipidemia: Secondary | ICD-10-CM

## 2023-10-08 DIAGNOSIS — E039 Hypothyroidism, unspecified: Secondary | ICD-10-CM

## 2023-10-09 ENCOUNTER — Other Ambulatory Visit (HOSPITAL_BASED_OUTPATIENT_CLINIC_OR_DEPARTMENT_OTHER): Payer: Medicaid Other

## 2023-10-19 ENCOUNTER — Telehealth (HOSPITAL_BASED_OUTPATIENT_CLINIC_OR_DEPARTMENT_OTHER): Payer: Self-pay | Admitting: Family Medicine

## 2023-10-19 ENCOUNTER — Other Ambulatory Visit (HOSPITAL_BASED_OUTPATIENT_CLINIC_OR_DEPARTMENT_OTHER): Payer: Self-pay | Admitting: *Deleted

## 2023-10-19 ENCOUNTER — Other Ambulatory Visit (HOSPITAL_BASED_OUTPATIENT_CLINIC_OR_DEPARTMENT_OTHER): Payer: Medicaid Other

## 2023-10-19 DIAGNOSIS — E039 Hypothyroidism, unspecified: Secondary | ICD-10-CM

## 2023-10-19 DIAGNOSIS — E782 Mixed hyperlipidemia: Secondary | ICD-10-CM

## 2023-10-19 DIAGNOSIS — I10 Essential (primary) hypertension: Secondary | ICD-10-CM

## 2023-10-19 MED ORDER — ATORVASTATIN CALCIUM 10 MG PO TABS: 10.0000 mg | ORAL_TABLET | Freq: Every day | ORAL | 1 refills | Status: AC

## 2023-10-19 MED ORDER — LEVOTHYROXINE SODIUM 112 MCG PO TABS: 112.0000 ug | ORAL_TABLET | Freq: Every day | ORAL | 1 refills | Status: DC

## 2023-10-19 MED ORDER — AMLODIPINE BESYLATE 10 MG PO TABS: 10.0000 mg | ORAL_TABLET | Freq: Every day | ORAL | 1 refills | Status: DC

## 2023-10-19 NOTE — Telephone Encounter (Signed)
 Pt medication refills sent to pharmacy. Does need to make an appt to follow up with de [place]

## 2023-10-19 NOTE — Telephone Encounter (Signed)
 Pt came in and stated he was out of his amlodipine , atorvastatin , and levothyroxine  and wanted to know if he could get a refill on those for a 30 day supply. He also stated he has not been taking his medication as prescribed so his BP has been a little high and running for exercise has not really helped lower his levels, but he is still taking his lisinopril . Please advise pt

## 2023-10-20 LAB — LIPID PANEL
Chol/HDL Ratio: 3.7 {ratio} (ref 0.0–5.0)
Cholesterol, Total: 246 mg/dL — ABNORMAL HIGH (ref 100–199)
HDL: 66 mg/dL (ref >39)
LDL Chol Calc (NIH): 168 mg/dL — ABNORMAL HIGH (ref 0–99)
Triglycerides: 71 mg/dL (ref 0–149)
VLDL Cholesterol Cal: 12 mg/dL (ref 5–40)

## 2023-10-20 LAB — TSH: TSH: 21.3 u[IU]/mL — ABNORMAL HIGH (ref 0.450–4.500)

## 2023-10-23 ENCOUNTER — Encounter (HOSPITAL_BASED_OUTPATIENT_CLINIC_OR_DEPARTMENT_OTHER): Payer: Self-pay | Admitting: *Deleted

## 2023-11-19 ENCOUNTER — Ambulatory Visit (HOSPITAL_BASED_OUTPATIENT_CLINIC_OR_DEPARTMENT_OTHER): Payer: Medicaid Other | Admitting: Family Medicine

## 2024-05-12 ENCOUNTER — Other Ambulatory Visit (HOSPITAL_BASED_OUTPATIENT_CLINIC_OR_DEPARTMENT_OTHER): Payer: Self-pay | Admitting: *Deleted

## 2024-05-12 ENCOUNTER — Telehealth (HOSPITAL_BASED_OUTPATIENT_CLINIC_OR_DEPARTMENT_OTHER): Payer: Self-pay | Admitting: Family Medicine

## 2024-05-12 DIAGNOSIS — I10 Essential (primary) hypertension: Secondary | ICD-10-CM

## 2024-05-12 MED ORDER — LEVOTHYROXINE SODIUM 112 MCG PO TABS: 112.0000 ug | ORAL_TABLET | Freq: Every day | ORAL | 1 refills | Status: DC

## 2024-05-12 MED ORDER — AMLODIPINE BESYLATE 10 MG PO TABS: 10.0000 mg | ORAL_TABLET | Freq: Every day | ORAL | 1 refills | Status: AC

## 2024-05-12 MED ORDER — LISINOPRIL 10 MG PO TABS: 10.0000 mg | ORAL_TABLET | Freq: Every day | ORAL | 1 refills | Status: AC

## 2024-05-12 NOTE — Telephone Encounter (Signed)
 Patient medication sent to pharmacy. Please remind patient to keep follow up appointment

## 2024-05-12 NOTE — Telephone Encounter (Signed)
 Pt is requesting refills for amlodipine , atorvastatin , synthroid , and lisinopril  if possible. Has not taking any for a while now after running out. Patient is scheduled for late August for appointment please advise

## 2024-06-05 ENCOUNTER — Ambulatory Visit (HOSPITAL_BASED_OUTPATIENT_CLINIC_OR_DEPARTMENT_OTHER): Admitting: Family Medicine

## 2024-07-09 ENCOUNTER — Encounter (HOSPITAL_BASED_OUTPATIENT_CLINIC_OR_DEPARTMENT_OTHER): Payer: Self-pay | Admitting: *Deleted

## 2024-08-19 ENCOUNTER — Other Ambulatory Visit (HOSPITAL_BASED_OUTPATIENT_CLINIC_OR_DEPARTMENT_OTHER): Payer: Self-pay | Admitting: *Deleted

## 2024-08-19 MED ORDER — LEVOTHYROXINE SODIUM 112 MCG PO TABS: 112.0000 ug | ORAL_TABLET | Freq: Every day | ORAL | 3 refills | Status: AC

## 2024-10-31 ENCOUNTER — Other Ambulatory Visit (HOSPITAL_BASED_OUTPATIENT_CLINIC_OR_DEPARTMENT_OTHER): Payer: Self-pay | Admitting: Family Medicine

## 2024-10-31 DIAGNOSIS — I10 Essential (primary) hypertension: Secondary | ICD-10-CM
